# Patient Record
Sex: Male | Born: 1955 | Race: Black or African American | Hispanic: No | Smoking: Current every day smoker
Health system: Southern US, Community
[De-identification: ages and names within clinical notes are randomized; demographics above are authoritative.]

## PROBLEM LIST (undated history)

## (undated) DIAGNOSIS — I10 Essential (primary) hypertension: Secondary | ICD-10-CM

## (undated) DIAGNOSIS — K219 Gastro-esophageal reflux disease without esophagitis: Secondary | ICD-10-CM

## (undated) DIAGNOSIS — E079 Disorder of thyroid, unspecified: Secondary | ICD-10-CM

## (undated) DIAGNOSIS — D696 Thrombocytopenia, unspecified: Secondary | ICD-10-CM

## (undated) DIAGNOSIS — F209 Schizophrenia, unspecified: Secondary | ICD-10-CM

---

## 2012-02-03 ENCOUNTER — Emergency Department: Payer: Self-pay | Admitting: Emergency Medicine

## 2012-02-03 LAB — COMPREHENSIVE METABOLIC PANEL
Alkaline Phosphatase: 63 U/L (ref 50–136)
Anion Gap: 4 — ABNORMAL LOW (ref 7–16)
BUN: 15 mg/dL (ref 7–18)
Bilirubin,Total: 0.3 mg/dL (ref 0.2–1.0)
Chloride: 113 mmol/L — ABNORMAL HIGH (ref 98–107)
EGFR (African American): 53 — ABNORMAL LOW
Glucose: 70 mg/dL (ref 65–99)
Osmolality: 284 (ref 275–301)
Potassium: 4.2 mmol/L (ref 3.5–5.1)
SGOT(AST): 43 U/L — ABNORMAL HIGH (ref 15–37)
Sodium: 143 mmol/L (ref 136–145)
Total Protein: 8 g/dL (ref 6.4–8.2)

## 2012-02-03 LAB — CBC
HCT: 35 % — ABNORMAL LOW (ref 40.0–52.0)
MCHC: 33.2 g/dL (ref 32.0–36.0)
MCV: 102 fL — ABNORMAL HIGH (ref 80–100)
RBC: 3.44 10*6/uL — ABNORMAL LOW (ref 4.40–5.90)
RDW: 18.9 % — ABNORMAL HIGH (ref 11.5–14.5)
WBC: 4.8 10*3/uL (ref 3.8–10.6)

## 2012-02-03 LAB — URINALYSIS, COMPLETE
Bacteria: NONE SEEN
Bilirubin,UR: NEGATIVE
Blood: NEGATIVE
Glucose,UR: NEGATIVE mg/dL (ref 0–75)
Leukocyte Esterase: NEGATIVE
Nitrite: NEGATIVE
Ph: 6 (ref 4.5–8.0)
RBC,UR: 1 /HPF (ref 0–5)
Specific Gravity: 1.011 (ref 1.003–1.030)
Squamous Epithelial: NONE SEEN
WBC UR: 1 /HPF (ref 0–5)

## 2012-02-27 ENCOUNTER — Inpatient Hospital Stay: Payer: Self-pay | Admitting: Internal Medicine

## 2012-02-27 LAB — URINALYSIS, COMPLETE
Bacteria: NONE SEEN
Bilirubin,UR: NEGATIVE
Glucose,UR: NEGATIVE mg/dL (ref 0–75)
Ketone: NEGATIVE
Leukocyte Esterase: NEGATIVE
Protein: NEGATIVE
RBC,UR: NONE SEEN /HPF (ref 0–5)
Specific Gravity: 1.006 (ref 1.003–1.030)
WBC UR: <1 /HPF (ref 0–5)

## 2012-02-27 LAB — CBC
HCT: 32.7 % — ABNORMAL LOW (ref 40.0–52.0)
MCH: 33.7 pg (ref 26.0–34.0)
MCV: 101 fL — ABNORMAL HIGH (ref 80–100)
Platelet: 76 10*3/uL — ABNORMAL LOW (ref 150–440)
RDW: 19.6 % — ABNORMAL HIGH (ref 11.5–14.5)

## 2012-02-27 LAB — TROPONIN I: Troponin-I: <0.02 ng/mL

## 2012-02-27 LAB — ETHANOL
Ethanol %: <0.003 % (ref 0.000–0.080)
Ethanol: <3 mg/dL

## 2012-02-27 LAB — COMPREHENSIVE METABOLIC PANEL
Albumin: 2.6 g/dL — ABNORMAL LOW (ref 3.4–5.0)
Alkaline Phosphatase: 57 U/L (ref 50–136)
Bilirubin,Total: 0.3 mg/dL (ref 0.2–1.0)
Calcium, Total: 8.8 mg/dL (ref 8.5–10.1)
Chloride: 117 mmol/L — ABNORMAL HIGH (ref 98–107)
Co2: 26 mmol/L (ref 21–32)
EGFR (African American): 56 — ABNORMAL LOW
EGFR (Non-African Amer.): 48 — ABNORMAL LOW
Osmolality: 295 (ref 275–301)
SGOT(AST): 45 U/L — ABNORMAL HIGH (ref 15–37)
SGPT (ALT): 37 U/L (ref 12–78)
Sodium: 149 mmol/L — ABNORMAL HIGH (ref 136–145)

## 2012-02-27 LAB — SALICYLATE LEVEL: Salicylates, Serum: <1.7 mg/dL

## 2012-02-27 LAB — LITHIUM LEVEL: Lithium: 1.26 mmol/L — ABNORMAL HIGH

## 2012-02-28 LAB — DIFFERENTIAL
Basophil #: 0 10*3/uL (ref 0.0–0.1)
Basophil %: 0.4 %
Eosinophil #: 0 10*3/uL (ref 0.0–0.7)
Eosinophil %: 0 %
Lymphocyte %: 31.6 %
Monocyte %: 8.3 %
Neutrophil #: 2.9 10*3/uL (ref 1.4–6.5)

## 2012-02-28 LAB — BASIC METABOLIC PANEL
BUN: 11 mg/dL (ref 7–18)
Calcium, Total: 9.2 mg/dL (ref 8.5–10.1)
Chloride: 115 mmol/L — ABNORMAL HIGH (ref 98–107)
Co2: 28 mmol/L (ref 21–32)
Creatinine: 1.43 mg/dL — ABNORMAL HIGH (ref 0.60–1.30)
EGFR (African American): >60
Osmolality: 294 (ref 275–301)
Potassium: 4.1 mmol/L (ref 3.5–5.1)

## 2012-02-28 LAB — VALPROIC ACID LEVEL: Valproic Acid: 60 ug/mL

## 2012-02-29 LAB — BASIC METABOLIC PANEL
Anion Gap: 10 (ref 7–16)
BUN: 9 mg/dL (ref 7–18)
Calcium, Total: 9.1 mg/dL (ref 8.5–10.1)
Chloride: 111 mmol/L — ABNORMAL HIGH (ref 98–107)
Creatinine: 1.27 mg/dL (ref 0.60–1.30)
EGFR (African American): >60
EGFR (Non-African Amer.): >60
Glucose: 80 mg/dL (ref 65–99)
Osmolality: 290 (ref 275–301)
Potassium: 3.9 mmol/L (ref 3.5–5.1)

## 2012-02-29 LAB — LITHIUM LEVEL: Lithium: 0.54 mmol/L — ABNORMAL LOW

## 2012-03-01 LAB — CBC WITH DIFFERENTIAL/PLATELET
Eosinophil #: 0 10*3/uL (ref 0.0–0.7)
Eosinophil %: 0 %
HCT: 31.1 % — ABNORMAL LOW (ref 40.0–52.0)
Lymphocyte #: 2 10*3/uL (ref 1.0–3.6)
MCH: 33.8 pg (ref 26.0–34.0)
MCHC: 33.5 g/dL (ref 32.0–36.0)
MCV: 101 fL — ABNORMAL HIGH (ref 80–100)
Neutrophil %: 48.3 %
RDW: 19.7 % — ABNORMAL HIGH (ref 11.5–14.5)

## 2012-03-02 LAB — CBC WITH DIFFERENTIAL/PLATELET
Basophil #: 0 10*3/uL (ref 0.0–0.1)
Basophil %: 0.5 %
Eosinophil #: 0 10*3/uL (ref 0.0–0.7)
HCT: 30.7 % — ABNORMAL LOW (ref 40.0–52.0)
HGB: 10.2 g/dL — ABNORMAL LOW (ref 13.0–18.0)
Lymphocyte %: 34.6 %
Monocyte %: 11.7 %
Neutrophil #: 2.5 10*3/uL (ref 1.4–6.5)
Neutrophil %: 53.1 %
RBC: 3.04 10*6/uL — ABNORMAL LOW (ref 4.40–5.90)
RDW: 20.3 % — ABNORMAL HIGH (ref 11.5–14.5)
WBC: 4.8 10*3/uL (ref 3.8–10.6)

## 2012-03-03 LAB — BASIC METABOLIC PANEL
Anion Gap: 6 — ABNORMAL LOW (ref 7–16)
BUN: 12 mg/dL (ref 7–18)
BUN: 10 mg/dL (ref 7–18)
Calcium, Total: 8.3 mg/dL — ABNORMAL LOW (ref 8.5–10.1)
EGFR (African American): >60
EGFR (African American): >60
EGFR (Non-African Amer.): 60 — ABNORMAL LOW
EGFR (Non-African Amer.): >60
Glucose: 77 mg/dL (ref 65–99)
Glucose: 102 mg/dL — ABNORMAL HIGH (ref 65–99)
Osmolality: 289 (ref 275–301)
Potassium: 3.9 mmol/L (ref 3.5–5.1)
Potassium: 3.6 mmol/L (ref 3.5–5.1)
Sodium: 146 mmol/L — ABNORMAL HIGH (ref 136–145)
Sodium: 143 mmol/L (ref 136–145)

## 2012-09-24 ENCOUNTER — Emergency Department: Payer: Self-pay | Admitting: Emergency Medicine

## 2012-09-24 LAB — URINALYSIS, COMPLETE
Bacteria: NONE SEEN
Bilirubin,UR: NEGATIVE
Glucose,UR: NEGATIVE mg/dL (ref 0–75)
Ketone: NEGATIVE
Leukocyte Esterase: NEGATIVE
Nitrite: NEGATIVE
Ph: 6 (ref 4.5–8.0)
Protein: NEGATIVE
RBC,UR: 2 /HPF (ref 0–5)
WBC UR: NONE SEEN /HPF (ref 0–5)

## 2012-09-24 LAB — DRUG SCREEN, URINE
Amphetamines, Ur Screen: NEGATIVE (ref ?–1000)
Barbiturates, Ur Screen: NEGATIVE (ref ?–200)
Benzodiazepine, Ur Scrn: NEGATIVE (ref ?–200)
Cocaine Metabolite,Ur ~~LOC~~: NEGATIVE (ref ?–300)
MDMA (Ecstasy)Ur Screen: NEGATIVE (ref ?–500)
Opiate, Ur Screen: NEGATIVE (ref ?–300)
Phencyclidine (PCP) Ur S: NEGATIVE (ref ?–25)
Tricyclic, Ur Screen: NEGATIVE (ref ?–1000)

## 2012-09-24 LAB — COMPREHENSIVE METABOLIC PANEL
Albumin: 3.5 g/dL (ref 3.4–5.0)
Alkaline Phosphatase: 143 U/L — ABNORMAL HIGH (ref 50–136)
BUN: 7 mg/dL (ref 7–18)
Bilirubin,Total: 0.3 mg/dL (ref 0.2–1.0)
Calcium, Total: 9.1 mg/dL (ref 8.5–10.1)
Chloride: 112 mmol/L — ABNORMAL HIGH (ref 98–107)
Creatinine: 0.87 mg/dL (ref 0.60–1.30)
EGFR (Non-African Amer.): >60
Glucose: 88 mg/dL (ref 65–99)
Osmolality: 279 (ref 275–301)
Potassium: 3.7 mmol/L (ref 3.5–5.1)
SGOT(AST): 29 U/L (ref 15–37)
SGPT (ALT): 24 U/L (ref 12–78)
Total Protein: 8 g/dL (ref 6.4–8.2)

## 2012-09-24 LAB — CBC
MCH: 32.8 pg (ref 26.0–34.0)
MCHC: 33.7 g/dL (ref 32.0–36.0)
MCV: 97 fL (ref 80–100)
RBC: 4.4 10*6/uL (ref 4.40–5.90)

## 2012-09-24 LAB — ETHANOL: Ethanol %: <0.003 % (ref 0.000–0.080)

## 2012-09-25 LAB — CBC WITH DIFFERENTIAL/PLATELET
HCT: 39.6 % — ABNORMAL LOW (ref 40.0–52.0)
HGB: 13.3 g/dL (ref 13.0–18.0)
MCH: 32.5 pg (ref 26.0–34.0)
MCHC: 33.5 g/dL (ref 32.0–36.0)
Monocytes: 8 %
Platelet: 168 10*3/uL (ref 150–440)
Segmented Neutrophils: 56 %
WBC: 5.5 10*3/uL (ref 3.8–10.6)

## 2012-10-02 ENCOUNTER — Emergency Department: Payer: Self-pay | Admitting: Emergency Medicine

## 2012-10-02 LAB — COMPREHENSIVE METABOLIC PANEL
Albumin: 3.3 g/dL — ABNORMAL LOW (ref 3.4–5.0)
Alkaline Phosphatase: 138 U/L — ABNORMAL HIGH (ref 50–136)
Anion Gap: 7 (ref 7–16)
BUN: 8 mg/dL (ref 7–18)
Calcium, Total: 9 mg/dL (ref 8.5–10.1)
Chloride: 109 mmol/L — ABNORMAL HIGH (ref 98–107)
Creatinine: 0.9 mg/dL (ref 0.60–1.30)
EGFR (Non-African Amer.): >60
Glucose: 91 mg/dL (ref 65–99)
Osmolality: 279 (ref 275–301)
Potassium: 4 mmol/L (ref 3.5–5.1)
SGPT (ALT): 27 U/L (ref 12–78)
Sodium: 141 mmol/L (ref 136–145)
Total Protein: 7.5 g/dL (ref 6.4–8.2)

## 2012-10-02 LAB — URINALYSIS, COMPLETE
Bacteria: NONE SEEN
Bilirubin,UR: NEGATIVE
Blood: NEGATIVE
Glucose,UR: NEGATIVE mg/dL (ref 0–75)
Ketone: NEGATIVE
Leukocyte Esterase: NEGATIVE
Nitrite: NEGATIVE
Protein: NEGATIVE
RBC,UR: <1 /HPF (ref 0–5)
Specific Gravity: 1.013 (ref 1.003–1.030)
Squamous Epithelial: NONE SEEN
WBC UR: NONE SEEN /HPF (ref 0–5)

## 2012-10-02 LAB — CBC
HCT: 43.6 % (ref 40.0–52.0)
MCV: 98 fL (ref 80–100)
Platelet: 203 10*3/uL (ref 150–440)
RBC: 4.46 10*6/uL (ref 4.40–5.90)
WBC: 6.2 10*3/uL (ref 3.8–10.6)

## 2012-10-02 LAB — TSH: Thyroid Stimulating Horm: 30.4 u[IU]/mL — ABNORMAL HIGH

## 2012-10-02 LAB — SALICYLATE LEVEL: Salicylates, Serum: 2.9 mg/dL — ABNORMAL HIGH

## 2012-10-02 LAB — DRUG SCREEN, URINE
Amphetamines, Ur Screen: NEGATIVE (ref ?–1000)
Barbiturates, Ur Screen: NEGATIVE (ref ?–200)
Cannabinoid 50 Ng, Ur ~~LOC~~: NEGATIVE (ref ?–50)
Cocaine Metabolite,Ur ~~LOC~~: NEGATIVE (ref ?–300)
Methadone, Ur Screen: NEGATIVE (ref ?–300)
Opiate, Ur Screen: NEGATIVE (ref ?–300)
Phencyclidine (PCP) Ur S: NEGATIVE (ref ?–25)
Tricyclic, Ur Screen: NEGATIVE (ref ?–1000)

## 2012-10-02 LAB — ETHANOL
Ethanol %: <0.003 % (ref 0.000–0.080)
Ethanol: <3 mg/dL

## 2012-10-03 LAB — DIFFERENTIAL
Basophil #: 0 10*3/uL (ref 0.0–0.1)
Basophil %: 0.4 %
Eosinophil #: 0 10*3/uL (ref 0.0–0.7)
Eosinophil %: 0.1 %
Lymphocyte #: 2.3 10*3/uL (ref 1.0–3.6)
Lymphocyte %: 37.5 %
Monocyte #: 0.7 x10 3/mm (ref 0.2–1.0)
Monocyte %: 11 %
Neutrophil #: 3.1 10*3/uL (ref 1.4–6.5)
Neutrophil %: 51 %

## 2012-10-03 LAB — WBC: WBC: 6 10*3/uL (ref 3.8–10.6)

## 2012-12-22 LAB — COMPREHENSIVE METABOLIC PANEL
Albumin: 3.4 g/dL (ref 3.4–5.0)
Alkaline Phosphatase: 144 U/L — ABNORMAL HIGH (ref 50–136)
Anion Gap: 6 — ABNORMAL LOW (ref 7–16)
BUN: 9 mg/dL (ref 7–18)
Bilirubin,Total: 0.4 mg/dL (ref 0.2–1.0)
Calcium, Total: 9.3 mg/dL (ref 8.5–10.1)
Chloride: 113 mmol/L — ABNORMAL HIGH (ref 98–107)
Creatinine: 1.03 mg/dL (ref 0.60–1.30)
EGFR (African American): >60
EGFR (Non-African Amer.): >60
Glucose: 118 mg/dL — ABNORMAL HIGH (ref 65–99)
Osmolality: 285 (ref 275–301)
Potassium: 3.6 mmol/L (ref 3.5–5.1)
SGOT(AST): 26 U/L (ref 15–37)
SGPT (ALT): 25 U/L (ref 12–78)

## 2012-12-22 LAB — CBC
HCT: 43.7 % (ref 40.0–52.0)
MCHC: 33.4 g/dL (ref 32.0–36.0)
MCV: 93 fL (ref 80–100)
Platelet: 27 10*3/uL — CL (ref 150–440)
RDW: 14.3 % (ref 11.5–14.5)

## 2012-12-22 LAB — TSH: Thyroid Stimulating Horm: 0.668 u[IU]/mL

## 2012-12-22 LAB — ETHANOL: Ethanol: <3 mg/dL

## 2012-12-23 LAB — DIFFERENTIAL
Basophil #: 0 10*3/uL (ref 0.0–0.1)
Eosinophil %: 0 %
Monocyte #: 0.6 x10 3/mm (ref 0.2–1.0)
Monocyte %: 11 %
Neutrophil #: 2.9 10*3/uL (ref 1.4–6.5)

## 2012-12-23 LAB — URINALYSIS, COMPLETE
Bilirubin,UR: NEGATIVE
Glucose,UR: NEGATIVE mg/dL (ref 0–75)
Leukocyte Esterase: NEGATIVE
Nitrite: NEGATIVE
Protein: NEGATIVE
Squamous Epithelial: NONE SEEN
WBC UR: 4 /HPF (ref 0–5)

## 2012-12-23 LAB — DRUG SCREEN, URINE
Amphetamines, Ur Screen: NEGATIVE (ref ?–1000)
Barbiturates, Ur Screen: NEGATIVE (ref ?–200)
Cocaine Metabolite,Ur ~~LOC~~: NEGATIVE (ref ?–300)
Methadone, Ur Screen: NEGATIVE (ref ?–300)
Opiate, Ur Screen: NEGATIVE (ref ?–300)
Tricyclic, Ur Screen: NEGATIVE (ref ?–1000)

## 2012-12-24 ENCOUNTER — Inpatient Hospital Stay: Payer: Self-pay | Admitting: Psychiatry

## 2012-12-25 LAB — CBC WITH DIFFERENTIAL/PLATELET
Eosinophil #: 0 10*3/uL (ref 0.0–0.7)
Eosinophil %: 0.1 %
HCT: 42 % (ref 40.0–52.0)
HGB: 13.7 g/dL (ref 13.0–18.0)
MCH: 30.3 pg (ref 26.0–34.0)
MCHC: 32.6 g/dL (ref 32.0–36.0)
Neutrophil #: 3.1 10*3/uL (ref 1.4–6.5)
Neutrophil %: 55.5 %
Platelet: 62 10*3/uL — ABNORMAL LOW (ref 150–440)
RBC: 4.52 10*6/uL (ref 4.40–5.90)
WBC: 5.6 10*3/uL (ref 3.8–10.6)

## 2012-12-25 LAB — DIFFERENTIAL: Monocytes: 10 %

## 2012-12-26 LAB — CBC WITH DIFFERENTIAL/PLATELET
Basophil %: 0.4 %
Eosinophil #: 0 10*3/uL (ref 0.0–0.7)
Eosinophil %: 0 %
HCT: 43.5 % (ref 40.0–52.0)
Lymphocyte #: 1.9 10*3/uL (ref 1.0–3.6)
Lymphocyte %: 32.3 %
MCHC: 33.2 g/dL (ref 32.0–36.0)
MCV: 94 fL (ref 80–100)
Monocyte #: 0.5 x10 3/mm (ref 0.2–1.0)
Monocyte %: 9.2 %
Platelet: 66 10*3/uL — ABNORMAL LOW (ref 150–440)
RBC: 4.62 10*6/uL (ref 4.40–5.90)
RDW: 14.3 % (ref 11.5–14.5)
WBC: 5.9 10*3/uL (ref 3.8–10.6)

## 2012-12-26 LAB — COMPREHENSIVE METABOLIC PANEL
Albumin: 3.3 g/dL — ABNORMAL LOW (ref 3.4–5.0)
Alkaline Phosphatase: 154 U/L — ABNORMAL HIGH (ref 50–136)
Anion Gap: 8 (ref 7–16)
Bilirubin,Total: 0.3 mg/dL (ref 0.2–1.0)
Chloride: 108 mmol/L — ABNORMAL HIGH (ref 98–107)
EGFR (Non-African Amer.): >60
Glucose: 137 mg/dL — ABNORMAL HIGH (ref 65–99)
Potassium: 4.1 mmol/L (ref 3.5–5.1)
SGOT(AST): 25 U/L (ref 15–37)
Total Protein: 7.5 g/dL (ref 6.4–8.2)

## 2012-12-26 LAB — PROTIME-INR: INR: 0.9

## 2012-12-27 LAB — PLATELET COUNT: Platelet: 75 10*3/uL — ABNORMAL LOW (ref 150–440)

## 2012-12-29 LAB — DIFFERENTIAL
Basophil %: 0.6 %
Eosinophil #: 0 10*3/uL (ref 0.0–0.7)
Eosinophil %: 0 %
Lymphocyte #: 2.4 10*3/uL (ref 1.0–3.6)
Lymphocyte %: 40.2 %
Monocyte #: 0.8 x10 3/mm (ref 0.2–1.0)
Neutrophil #: 2.7 10*3/uL (ref 1.4–6.5)

## 2012-12-29 LAB — WBC: WBC: 5.9 10*3/uL (ref 3.8–10.6)

## 2012-12-30 LAB — CBC WITH DIFFERENTIAL/PLATELET
Basophil #: 0 10*3/uL (ref 0.0–0.1)
Eosinophil #: 0 10*3/uL (ref 0.0–0.7)
Eosinophil %: 0 %
HGB: 13.5 g/dL (ref 13.0–18.0)
Lymphocyte %: 38.7 %
MCH: 31 pg (ref 26.0–34.0)
MCHC: 33.2 g/dL (ref 32.0–36.0)
MCV: 94 fL (ref 80–100)
Monocyte #: 0.7 x10 3/mm (ref 0.2–1.0)
Neutrophil %: 47.8 %
RBC: 4.34 10*6/uL — ABNORMAL LOW (ref 4.40–5.90)
WBC: 5.4 10*3/uL (ref 3.8–10.6)

## 2013-01-01 LAB — CBC WITH DIFFERENTIAL/PLATELET
Eosinophil #: 0 10*3/uL (ref 0.0–0.7)
HCT: 40.2 % (ref 40.0–52.0)
HGB: 13.5 g/dL (ref 13.0–18.0)
Lymphocyte #: 2.1 10*3/uL (ref 1.0–3.6)
Lymphocyte %: 38.6 %
MCH: 31 pg (ref 26.0–34.0)
Monocyte %: 13.2 %
Neutrophil %: 47.7 %
RBC: 4.34 10*6/uL — ABNORMAL LOW (ref 4.40–5.90)
RDW: 14 % (ref 11.5–14.5)
WBC: 5.4 10*3/uL (ref 3.8–10.6)

## 2013-01-03 LAB — PLATELET COUNT: Platelet: 85 10*3/uL — ABNORMAL LOW (ref 150–440)

## 2013-02-03 ENCOUNTER — Emergency Department: Payer: Self-pay | Admitting: Emergency Medicine

## 2013-02-03 LAB — COMPREHENSIVE METABOLIC PANEL
Albumin: 3.4 g/dL (ref 3.4–5.0)
Anion Gap: 3 — ABNORMAL LOW (ref 7–16)
BUN: 13 mg/dL (ref 7–18)
Bilirubin,Total: 0.3 mg/dL (ref 0.2–1.0)
Calcium, Total: 9.2 mg/dL (ref 8.5–10.1)
EGFR (African American): >60
EGFR (Non-African Amer.): >60
Glucose: 83 mg/dL (ref 65–99)
Osmolality: 273 (ref 275–301)
Potassium: 3.7 mmol/L (ref 3.5–5.1)
SGPT (ALT): 23 U/L (ref 12–78)
Sodium: 137 mmol/L (ref 136–145)
Total Protein: 7.5 g/dL (ref 6.4–8.2)

## 2013-02-03 LAB — DRUG SCREEN, URINE
Benzodiazepine, Ur Scrn: NEGATIVE (ref ?–200)
Cocaine Metabolite,Ur ~~LOC~~: NEGATIVE (ref ?–300)
MDMA (Ecstasy)Ur Screen: NEGATIVE (ref ?–500)
Opiate, Ur Screen: NEGATIVE (ref ?–300)

## 2013-02-03 LAB — CBC
HCT: 41 % (ref 40.0–52.0)
HGB: 14.2 g/dL (ref 13.0–18.0)
MCH: 32 pg (ref 26.0–34.0)
MCHC: 34.6 g/dL (ref 32.0–36.0)
RBC: 4.43 10*6/uL (ref 4.40–5.90)
WBC: 6 10*3/uL (ref 3.8–10.6)

## 2013-02-03 LAB — URINALYSIS, COMPLETE
Glucose,UR: NEGATIVE mg/dL (ref 0–75)
Ketone: NEGATIVE
Protein: NEGATIVE

## 2013-02-03 LAB — ETHANOL: Ethanol %: <0.003 % (ref 0.000–0.080)

## 2013-02-03 LAB — ACETAMINOPHEN LEVEL: Acetaminophen: <2 ug/mL

## 2013-02-03 LAB — SALICYLATE LEVEL: Salicylates, Serum: 4 mg/dL — ABNORMAL HIGH

## 2013-02-05 ENCOUNTER — Emergency Department: Payer: Self-pay | Admitting: Emergency Medicine

## 2013-02-05 LAB — DRUG SCREEN, URINE
Amphetamines, Ur Screen: NEGATIVE (ref ?–1000)
Benzodiazepine, Ur Scrn: NEGATIVE (ref ?–200)
Cannabinoid 50 Ng, Ur ~~LOC~~: NEGATIVE (ref ?–50)
Cocaine Metabolite,Ur ~~LOC~~: NEGATIVE (ref ?–300)
MDMA (Ecstasy)Ur Screen: NEGATIVE (ref ?–500)
Phencyclidine (PCP) Ur S: NEGATIVE (ref ?–25)

## 2013-02-05 LAB — CBC
HCT: 41.9 % (ref 40.0–52.0)
MCH: 31.7 pg (ref 26.0–34.0)
Platelet: 202 10*3/uL (ref 150–440)
RBC: 4.52 10*6/uL (ref 4.40–5.90)
RDW: 15 % — ABNORMAL HIGH (ref 11.5–14.5)

## 2013-02-05 LAB — URINALYSIS, COMPLETE
Bacteria: NONE SEEN
Bilirubin,UR: NEGATIVE
Blood: NEGATIVE
Glucose,UR: NEGATIVE mg/dL (ref 0–75)
Nitrite: NEGATIVE
RBC,UR: <1 /HPF (ref 0–5)
WBC UR: 3 /HPF (ref 0–5)

## 2013-02-05 LAB — COMPREHENSIVE METABOLIC PANEL
Albumin: 3.6 g/dL (ref 3.4–5.0)
Anion Gap: 4 — ABNORMAL LOW (ref 7–16)
Bilirubin,Total: 0.3 mg/dL (ref 0.2–1.0)
Chloride: 106 mmol/L (ref 98–107)
Co2: 28 mmol/L (ref 21–32)
EGFR (African American): >60
Glucose: 104 mg/dL — ABNORMAL HIGH (ref 65–99)
Potassium: 3.7 mmol/L (ref 3.5–5.1)
SGOT(AST): 22 U/L (ref 15–37)
SGPT (ALT): 24 U/L (ref 12–78)
Sodium: 138 mmol/L (ref 136–145)

## 2013-02-05 LAB — ETHANOL
Ethanol %: <0.003 % (ref 0.000–0.080)
Ethanol: <3 mg/dL

## 2013-02-12 LAB — DIFFERENTIAL
Basophil %: 0.6 %
Eosinophil %: 0 %
Lymphocyte #: 2.7 10*3/uL (ref 1.0–3.6)
Lymphocyte %: 37.2 %
Monocyte %: 7.3 %
Neutrophil #: 3.9 10*3/uL (ref 1.4–6.5)

## 2013-02-12 LAB — WBC: WBC: 7.2 x10 3/mm 3

## 2013-02-22 LAB — ETHANOL
Ethanol %: <0.003 % (ref 0.000–0.080)
Ethanol: <3 mg/dL

## 2013-02-22 LAB — COMPREHENSIVE METABOLIC PANEL
Alkaline Phosphatase: 148 U/L — ABNORMAL HIGH (ref 50–136)
Anion Gap: 7 (ref 7–16)
BUN: 11 mg/dL (ref 7–18)
Bilirubin,Total: 0.4 mg/dL (ref 0.2–1.0)
Chloride: 108 mmol/L — ABNORMAL HIGH (ref 98–107)
Co2: 25 mmol/L (ref 21–32)
Creatinine: 1.12 mg/dL (ref 0.60–1.30)
EGFR (African American): >60
EGFR (Non-African Amer.): >60
Potassium: 3.7 mmol/L (ref 3.5–5.1)
SGOT(AST): 25 U/L (ref 15–37)
SGPT (ALT): 27 U/L (ref 12–78)

## 2013-02-22 LAB — CBC
HCT: 42.9 % (ref 40.0–52.0)
MCV: 93 fL (ref 80–100)
Platelet: 236 10*3/uL (ref 150–440)
RBC: 4.64 10*6/uL (ref 4.40–5.90)
RDW: 14.8 % — ABNORMAL HIGH (ref 11.5–14.5)
WBC: 8.3 10*3/uL (ref 3.8–10.6)

## 2013-02-22 LAB — TSH: Thyroid Stimulating Horm: 2.11 u[IU]/mL

## 2013-02-23 LAB — DRUG SCREEN, URINE
Amphetamines, Ur Screen: NEGATIVE
Barbiturates, Ur Screen: NEGATIVE
Benzodiazepine, Ur Scrn: NEGATIVE
Cannabinoid 50 Ng, Ur ~~LOC~~: NEGATIVE
Cocaine Metabolite,Ur ~~LOC~~: NEGATIVE
MDMA (Ecstasy)Ur Screen: NEGATIVE
Methadone, Ur Screen: NEGATIVE
Opiate, Ur Screen: NEGATIVE
Phencyclidine (PCP) Ur S: NEGATIVE
Tricyclic, Ur Screen: NEGATIVE

## 2013-02-23 LAB — DIFFERENTIAL
Basophil #: 0 10*3/uL (ref 0.0–0.1)
Basophil %: 0.5 %
Eosinophil #: 0 10*3/uL (ref 0.0–0.7)
Eosinophil %: 0 %
Monocyte %: 10.2 %
Neutrophil #: 3.7 10*3/uL (ref 1.4–6.5)

## 2013-02-23 LAB — URINALYSIS, COMPLETE
Bacteria: NONE SEEN
Ketone: NEGATIVE
Nitrite: NEGATIVE
Ph: 5 (ref 4.5–8.0)
Protein: NEGATIVE
RBC,UR: NONE SEEN /HPF (ref 0–5)
Specific Gravity: 1.005 (ref 1.003–1.030)
Squamous Epithelial: <1
WBC UR: 1 /HPF (ref 0–5)

## 2013-02-23 LAB — WBC: WBC: 6.1 10*3/uL (ref 3.8–10.6)

## 2013-02-24 ENCOUNTER — Inpatient Hospital Stay: Payer: Self-pay | Admitting: Psychiatry

## 2013-03-01 DIAGNOSIS — Z79899 Other long term (current) drug therapy: Secondary | ICD-10-CM

## 2013-03-01 LAB — VALPROIC ACID LEVEL: Valproic Acid: 90 ug/mL

## 2013-03-01 LAB — PLATELET COUNT: Platelet: 221 10*3/uL (ref 150–440)

## 2013-03-01 LAB — TROPONIN I: Troponin-I: <0.02 ng/mL

## 2013-03-02 LAB — DIFFERENTIAL
Basophil #: 0 10*3/uL (ref 0.0–0.1)
Basophil %: 0.2 %
Lymphocyte #: 1.6 10*3/uL (ref 1.0–3.6)
Lymphocyte %: 26.5 %
Monocyte #: 0.6 x10 3/mm (ref 0.2–1.0)
Neutrophil #: 3.9 10*3/uL (ref 1.4–6.5)
Neutrophil %: 63.6 %

## 2013-03-02 LAB — WBC: WBC: 6.1 10*3/uL (ref 3.8–10.6)

## 2013-03-09 LAB — DIFFERENTIAL
Basophil #: 0 10*3/uL (ref 0.0–0.1)
Eosinophil #: 0 10*3/uL (ref 0.0–0.7)
Lymphocyte #: 1.9 10*3/uL (ref 1.0–3.6)
Lymphocyte %: 36.1 %
Monocyte #: 0.7 x10 3/mm (ref 0.2–1.0)
Monocyte %: 12.6 %

## 2013-03-09 LAB — WBC: WBC: 5.2 10*3/uL (ref 3.8–10.6)

## 2013-03-10 LAB — PLATELET COUNT: Platelet: 216 10*3/uL (ref 150–440)

## 2014-09-27 NOTE — Consult Note (Signed)
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   Patient was seen by consultant.   Consult note dictated.   Recommend further assessment or treatment.   Orders entered.   Comments: Psychiatry: Patient seen. Reviewed history and labs. Current exam he can move all extremities and has no gross tremor but has poor fine motor control. Also has tardive dyskinesia. Is not a very useful historian.  Most likely problem is the elevated Lithium level. Will order stat recheck of Li and depakote and stat check of clozapine (they cant run it stat but we can get it drawn today). Will restart clozapine - not likely the cause of his presenting problem. Also check ammonia level in pt with hep C.  Electronic Signatures: Amar Keenum, Jackquline DenmarkJohn T (MD)  (Signed 20-Sep-13 11:59)  Authored: Brief Consult Note   Last Updated: 20-Sep-13 11:59 by Audery Amellapacs, Jacqulene Huntley T (MD)

## 2014-09-27 NOTE — Consult Note (Signed)
PATIENT NAME:  Randy Ferguson, Randy Ferguson MR#:  [id number] DATE OF BIRTH:  [birth year]  DATE OF CONSULTATION:  02/29/2012  REFERRING PHYSICIAN:  Dr. Elisabeth PigeonVachhani CONSULTING PHYSICIAN:  Hemang K. Sherryll BurgerShah, MD  REASON FOR CONSULTATION: Altered mental status and lethargy.   HISTORY OF PRESENT ILLNESS: [name] is a 59 year old African American gentleman with a history of schizophrenia, went to the new nursing home and seems like he did not receive his hypothyroid medication.   On admission he has a very elevated TSH. The nursing home sent him over here because he has been getting more lethargic and having some abnormal movements.   PAST MEDICAL HISTORY:  1. Schizophrenia.  2. Bipolar disorder.  3. Mild mental retardation.  4. Reflux.  5. Constipation. 6. Hypothyroidism. 7. Hepatitis C.  8. Latent tuberculosis infection. Negative in November 2012. 9. Treated for syphilis in the past.   PAST SURGICAL HISTORY: Negative.  FAMILY HISTORY: Not available.   MEDICATIONS: I reviewed his allergy and home medication list.   PHYSICAL EXAMINATION:  VITAL SIGNS: Temperature 98, pulse 86, respiratory rate 18, blood pressure 130/84, pulse ox 95%.   GENERAL: He is a middle-aged African American gentleman lying in bed, not in acute distress.   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 He is disabled.   LUNGS: Clear to auscultation.   HEART: S1, S2 heart sounds. Carotid exam did not reveal any bruit.   ABDOMEN: He has a protuberant abdomen.   MENTAL STATUS EXAMINATION: He was lethargic but arousable. As soon as he woke up, he wanted to eat and I gave him the things on his dish.   The patient is very difficult to understand, but he did follow one-step commands such as close your eyes, stick your tongue out, lift individual limbs which he did.    He has difficulty following two-step commands. He sometimes has childish behavior, such as when I asked him  to point to the ceiling he raised both hands to point to the ceiling.   He cannot follow two-step commands.   His voice is very difficult to understand and almost mumbling.   But I do not think he has a language disorder.   The patient also has difficulty with his attention and concentration.   He denied any active delusion or hallucination.   On his cranial nerves, his pupils were equal, round, and reactive. Extraocular movements are intact. His visual fields are very difficult to check for.   His face was symmetric. He does have some swelling of both upper eyelids.   His face was symmetric. Tongue was midline. Facial sensation seems to be intact. His hearing was difficult to assess for.   On his motor examination, he has mild hypertonia all over, but his strength seems to be symmetric. He does have some asterixis.   His sensations were intact to light touch. His reflexes were decreased.   ASSESSMENT AND PLAN:  Severe lethargy and altered mental status, likely from his metabolic encephalopathy with severe hypothyroidism, likely from not getting his medication.   He also has some hyperammonemia. This is likely coming from his Depakote.   The patient also had some elevated borderline levels of lithium and Depakote on admission which lithium has been held.   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   He does have tardive dyskinesia, likely from his chronic use of antipsychotic medications.  In terms of hypothyroidism, endocrine consult has been done and they have started replacement.   For his schizophrenia and other psychiatric issues, the patient has been followed by the psychiatrist.   Feel free to contact me with any further questions. I will follow this patient on an as-needed basis.    ____________________________ Durene Cal. Sherryll Burger, MD hks:ap D: 02/29/2012 17:51:10 ET T: 03/01/2012 11:51:23  ET JOB#: [id number]  cc: Hemang K. Sherryll Burger, MD, <Dictator>  Durene Cal [facility] MD ELECTRONICALLY SIGNED 03/02/2012 7:43

## 2014-09-27 NOTE — Consult Note (Signed)
Chief Complaint and History:   Referring Physician Dr. Sherryll BurgerShah    Chief Complaint Elevated TSH   Allergies:  No Known Allergies:   Assessment/Plan:   Orders/Results reviewed Orders reviewed and updated    Assessment/Plan 59 yo M with mental retardation and schizophrenia and hypothyroidism admitted yesterday with dizziness and altered mental status. He was found to have a TSH >100. I reviewed his records and a signed FL2 form from 11/2011 lists levothyroxine 125 mcg daily as one of his daily meds yet [facility] does not have the levothyroxine included. Patient was interviewed and examined and chart was reviewed.   A/ Profoundly uncontrolled hypothyroidism due to not taking levothyroxine. It seems this medication was no longer given to patient for at least the last 20 days and likely longer.    P/ Will restart levothyroxine. As he has no IV access and yet has already eaten today, I will give this IM. IM and IV are twice the potency of PO formulations of levothyroxine and yet he could tolerate a high initial dose of levothyroxine to start. Will give 200 mcg now IM and then restart levothyroxine 125 mcg daily. Recommend repeating the TSH in one week. I will recommend out-patient clinic follow-up in 3-4 weeks and will have this scheduled.  Full consult to be dictated.   Electronic Signatures: Raj JanusSolum, Anna M (MD)  (Signed 20-Sep-13 13:01)  Authored: Chief Complaint and History, ALLERGIES, Assessment/Plan   Last Updated: 20-Sep-13 13:01 by Raj JanusSolum, Anna M (MD)

## 2014-09-27 NOTE — Discharge Summary (Signed)
PATIENT NAME:  Randy Ferguson, Randy Ferguson DATE OF BIRTH:  Sep 16, 1955  DATE OF ADMISSION:  02/27/2012 DATE OF DISCHARGE:  03/03/2012   ADMITTING DIAGNOSIS: Altered mental status.   DISCHARGE DIAGNOSES:  1. Metabolic encephalopathy due to severe hypothyroidism. 2. Hyperammonemia likely due to Depakote use.  3. Questionable chronic liver disease.  4. History of hepatitis C. 5. Tardive dyskinesia  due to antipsychotic medications and tremor due to lithium toxicity.  6. Acute renal failure.  7. Hyponatremia.  8. Unsteady gait due to above.  9. Moderate to severe MR.  10. History of schizophrenia. 11. Gastroesophageal reflux disease.  12. Constipation.  13. Hepatitis C. 14. Benign prostatic hypertrophy.  15. Overactive bladder.   DISCHARGE CONDITION: Stable.   DISCHARGE MEDICATIONS:  1. The patient is to resume tamsulosin 0.4 mg p.o. daily.  2. Finasteride 5 mg p.o. daily.  3. Lorazepam 1 mg p.o. every six hours as needed.  4. Toviaz 8 mg p.o. daily.  5. Omeprazole 20 mg p.o. daily.  6. Vitamin D3, 5000 units once daily.  7. Clozapine 100 mg tablets, 5 tablets at bedtime which would 500 mg p.o. bedtime.  8. Docusate sodium 100 mg p.o. twice daily.  9. Lactulose 15 mL twice daily as needed.  10. Levothyroxine 125 mcg p.o. daily. The patient was advised not to drink or eat approximately 1 hour after he takes his levothyroxine.  11. He is not to take divalproex, sodium, lithium, or Loxapine unless recommended by primary care physician.   DIET: Regular. The patient was advised to drink plenty of fluids to have at least one pitcher of fluids placed at his bedside. His diet consistency with mechanical soft.   ACTIVITY LIMITATIONS: As tolerated.   FOLLOW-UP: Appointment with Dr. Lacie ScottsNiemeyer in two days after discharge as well as Dr. Tedd SiasSolum in one week after discharge. The patient was also advised to have  BMP checked in approximately 2 or three days, to have creatinine as well as  sodium levels checked to make sure that creatinine as well as sodium levels are not creeping up because of concerns of dehydration.   CONSULT: Dr Sherryll BurgerShah, Dr Toni Amendlapacs, Dr. Tedd SiasSolum.   RADIOLOGIC STUDIES: Chest portable, single view 19th of September 2013 showed hyperinflation with right lung base atelectasis. CT of head without contrast 02/27/2012 revealed no acute abnormality.   HISTORY OF PRESENT ILLNESS: The patient is a 59 year old African American male with past medical history significant for history of hypothyroidism. Was brought from group home because of abnormal movements as well as frequent falls. Please refer to Dr. Jim DesanctisVacchani's admission note on 02/27/2012. On arrival to the Emergency Room, the patient's temperature was 98, pulse was 82, respiratory rate 20, blood pressure 104/77, saturation was 97% on room air. Physical examination was unremarkable.. The patient was alert, however, not oriented. He was able to follow some commands. He had excessive abnormal movements in the limbs when he tried to move them. He was admitted to the hospital with diagnosis of lethargy.  It was concerning for possible metabolic encephalopathy due to hyponatremia as well as possibly medications.   LABORATORY, DIAGNOSTIC AND RADIOLOGICAL DATA: Initial labs revealed creatinine of 1.6, sodium 149, otherwise BMP was unremarkable. The patient's liver enzymes showed albumin level of 2.2, AST was elevated at 45, otherwise unremarkable. The patient's troponin level was less than 0.02. TSH was more than 100. The patient's lithium level was also elevated at 1.26. Valproic acid level was also elevated at 102. The patient's white blood cell  count was normal at 4.7, hemoglobin was 10.9, platelet count 76. Urinalysis was unremarkable. The patient's acetaminophen level was less than two, salicylate  level was less than 1.7.   EKG showed normal sinus rhythm at 85 beats per minute, normal axis, no acute ST-T changes. The patient had QTc  elevation to 433 ms.   The patient was admitted to the hospital for further evaluation. His psychiatric medications were placed on hold because of their abnormally high levels and concerns that the patient altered mental status could be related to lithium as well as valproic acid toxicity.  Consultations with Dr Toni Amend because of psychiatry disease as well as abnormal medication levels were obtained as well as Dr. Tedd Sias, because of a mildly elevated TSH to more than 100. Both of those physicians, Dr Toni Amend of as well as Dr. Tedd Sias, follow the patient along  while he was in the hospital.  1. In regards to his altered mental status,  it was felt that patient's altered mental status was metabolic encephalopathy due to severe hypothyroidism as well as possibly hyperammonemia, as the patient's ammonia level was found to be elevated to 60s as well as possibly toxicity due to valproic acid as well as lithium. Valproic acid as well as lithium were placed on hold, in fact, they were completely discontinued by Dr. Toni Amend. The patient's hyperthyroidism was treated initially with IV as well as IM medications and then later on oral medication doses. The patient is hyperammonemia was treated with lactulose and with this therapy the patient improved, however not significantly enough so it was felt that the patient could have too high dose of other psychiatric medications such as clozapine and the morning dose of clozapine was suspended. As soon as the morning dose of clozapine was suspended. the patient  woke up and he was much more active and energetic. Later on patient's clozapine levels came back and they were markedly abnormal. They were much higher than therapeutic recommended doses. The patient's clozapine  level was found to be combined total of 970 however, therapeutic effect was considered to be when some of clozapine as well as norclozapine concentrations were at least  450 ng/mL. It was felt that the patient  should discontinue his morning dose of clozapine and that was recommended by Dr. Toni Amend. In fact, Dr. Toni Amend felt that the patient's movement problems were likely related to his lithium as well as tardive dyskinesia. He felt that the patient had very significant tardive dyskinesia and is not likely to get much better and  for this reason, he recommended to use clozapine instead of typical antipsychotics. As clozapine level is very high,  he recommended to discontinue the morning dose and continued just afternoon dose. He felt that this morning dose of clozapine, the patient had unusual, oversedation as well as possibly some dizziness and as patient's clozapine was discontinued, the patient. The patient had physical therapy and he did fairly well. Group home was willing to see him back to group home with home health R.N. as well as physical therapy where the patient will be discharged today on the 24th of September 2013.  2.  He is to continue thyroid medications and follow up with Dr. Tedd Sias in the next one week after discharge to make decisions when to recheck his TSH or free T4 and free T3. 3.  In regards to hyperammonemia. The patient is to continue lactulose. He needs to follow up with his primary care physician and further investigate him for chronic  liver disease as he has history of hepatitis C and very likely patient's thrombocytopenia is related to his chronic liver disease.  4. The patient was noted to have  mild acute renal failure. On arrival to the Emergency Room, his creatinine was 1.6. With IV fluid hydration, his creatinine normalized, however, on the 24th of September 2013, patient's creatinine level was still elevated to high. It is recommended to follow the patient's creatinine level as outpatient and make sure that the patient does not have an abnormal creatinine level which is chronic for him already and could have been related to lithium in the past. As the patient's sodium level was also  abnormal elevated it was postulated patient also could have underlying diabetes insipidus again related to lithium itself. The patient was able to drink and eat well whenever his mental status improved and he was not noted to be excessive urination. However, it is recommended to follow the patient's sodium levels as well as creatinine levels as outpatient investigate and see him for possible diabetes if those levels are creeping up. Meanwhile, the patient is to continue to be recommended continuous oral fluid intake. He was advised to have at least one pitcher placed at his bedside with fluids and his caretakers were advised to encouraged him to drink plenty of fluids. During his stay in the hospital time, the patient was able to eat drink plenty and he was eating 100%  of offered food.  5. In regards to unsteady gait. It was felt that is was likely due to multiple problems, metabolic encephalopathy due to hypothyroidism, and hyperammonemia, as well as possible dyskinesia and tremors due to lithium, as well as toxicity as well as antipsychotic medication, high levels of antipsychotic psychotic medications. The patient is to continue physical therapy at home. 6. For his moderate to severe  moderate to severe  mental retardation, the patient is being discharged to group home for schizophrenia as mentioned above, the patient is to continue clozapine only at bedtime. He is to use only 500 mg at bedtime but not to take morning dose of clozapine. 7. For gastroesophageal reflux disease. The patient is to continue omeprazole. 8. For benign prostatic hypertrophy as well as overactive bladder. The patient is to continue tamsulosin, finasteride, as well as Toviaz.   The patient is being discharged in stable condition with the above-mentioned medications and follow-up. His vital signs on the day of discharge: Temperature 98.4, pulse 89, respiration rate 18, blood pressure 113/75, saturation 99% on room air at rest.    TIME SPENT: 40 minutes.     ____________________________ Katharina Caper, MD rv:ljs D: 03/04/2012 16:22:49 ET T: 03/05/2012 13:50:12 ET JOB#: [id number]  cc: Katharina Caper, MD, <Dictator> Laken Rog MD ELECTRONICALLY SIGNED 03/06/2012 15:03

## 2014-09-27 NOTE — Consult Note (Signed)
PATIENT NAME:  Randy Ferguson, Randy Ferguson MR#:  [id number] DATE OF BIRTH:  1955-11-01  DATE OF CONSULTATION:  02/28/2012  REFERRING PHYSICIAN:   CONSULTING PHYSICIAN:  Audery AmelJohn T. Rayann Jolley, MD  IDENTIFYING INFORMATION AND REASON FOR CONSULT: The patient is a 59 year old man brought into the Emergency Room by his group home with a complaint of abnormal movements and increase in falling. Consult for evaluation in a patient with chronic psychiatric problems.   HISTORY OF PRESENT ILLNESS: Information obtained primarily from the chart. The patient was able to try to talk with me, but his ability to convey information is not very useful. Evidently, he has been at his current group home for several months and they have noticed that he has had worsening trouble with his movement, coordination and with his falls. The exact time course is not although it well described. Additionally, the precise kind of movement problems is not very well described. It does not sound like they are likely to be seizures. There is no complaint that I see about anything that would be specifically psychiatric in nature. I do not see anything about him being violent, aggressive or self injuring. The patient is not able to give me any useful history. It appears that this is a chronically mentally ill gentleman in schizophrenia. He just recently moved to his current group home. He is able to tell me today that he has been in many hospitals in the past, although he cannot tell me much detail about it and cannot describe why he has been in psychiatric hospitals. He is not able to tell me anything about any of the specifics of his medicines or medicine dosing in the past. The medication record that is in the chart about his current medicine is clozapine 300 mg in the morning and 500 mg at night, Depakote 500 mg 2 tablets twice a day, Docusate 100 mg once a day, Finasteride 5 mg per day, lithium 300 mg in the morning and 600 at night, Lorazepam 1 mg every six  hours as needed, loxapine 25 mg once every 12 hours as needed, Omeprazole 20 mg per day, Tamsulosin 0.4 mg once a day, Toviaz 8 mg once a day and vitamin D3 5000 units once a day. We do not have any other history as far as I can tell about past psychiatric condition. Evidently, he has been treated by Dr. Sherin QuarryWeissman since being in our community. I do not know whether any of his medicines have been changed and I do not know when his last blood tests for any of them were done.   SOCIAL HISTORY: Currently living at a group home. I do not have any information about whether he has a guardian or power of attorney or where his family is and he is not able to give me any of that history.   REVIEW OF SYSTEMS: Again, the patient's ability to answer is limited by history. Denies suicidal or homicidal ideation. Says that he is still having trouble walking, but that he was able to get up and walk to the bathroom today. He does say that he has had some trouble falling recently. Does not endorse any other mental health or acute complaints.   MENTAL STATUS EXAM: Disheveled, chronically mentally ill-looking gentleman evaluated in his bed in the hospital. He was awake when I came into the room. He was alert and interacted with me. Made good eye contact. Psychomotor activity was fidgety. He was trying to sit up quite a bit. His ability to  sit up was limited by the way the bed was set and by his poor fine motor skills and also, probably by his distended abdomen. His speech was slurred and very difficult to understand. His affect was flat. His mood was stated as okay. His thoughts appeared to be simple and concrete and repetitive. Did not make any obviously bizarre statements. Did not indicate any hallucinations. Denied suicidal or homicidal ideation. He did not know where he was and could not tell me the year or date, but did seem to have some basic understanding that he was interacting with a doctor.   LABORATORY, DIAGNOSTIC AND  RADIOLOGICAL DATA: On admission his chemistry panel showed an elevated creatinine at 1.4, chloride elevated at 115. His thyroid stimulating hormone is over 100. His valproic acid level was 102, lithium level 1.26, both of which are high, but the lithium level more significantly so. His AST elevated at 45, albumin low at 2.6, chloride elevated at 117, sodium 149 and creatinine 1.6 on admission.   ASSESSMENT: 59 year old man with very little past history to go on but clearly with chronic mental illness who is presented to [place] as having a diagnosis of mental retardation and schizophrenia. He has been having more trouble moving and having more falls recently. I did not do a complete neurologic exam with the patient today. I do not know compared to yesterday or when he came in whether his current movement problems are any different than what he was having then. He clearly has gross tardive dyskinesia. He does not have a tremor and certainly does not have a typical lithium tremor, but could be having more neurologic problems related to the lithium. Again, without more specifics about exactly what they have been seeing changing, I think that possibilities here would be that his lithium level being too high has caused him to be unsteady and more uncoordinated, the severe hypothyroidism that he appears to have could be contributing to the neurologic problems, he obviously has severe tardive dyskinesia. The current medicine that he is taking usually would not make that worse except for the loxapine. I do not know whether they have been giving him loxapine regularly, but if they have that could be making the tardive dyskinesia worse.   TREATMENT PLAN: I agree with the initial plan of having discontinued all of his medicines, but I think he should be restarted on his clozapine. The clozapine is not likely to be a cause of his problems and without it, I would be afraid that his mental state could deteriorate. I did go ahead  and order a stat Clozaril level. They will not be able to run it stat, but at least we can get the blood drawn. I would continue off the lithium and Depakote for now until we see how the blood levels are changing. I have ordered stat levels to be done again today. I am glad to see that neurology is following up with him. I just saw the TSH. Looking through his labs right now, I am not sure how that is being addressed. Does not need admission to psychiatry. Will follow.   DIAGNOSIS PRINCIPLE AND PRIMARY:  AXIS I: Schizophrenia, undifferentiated.   SECONDARY DIAGNOSES:  AXIS I: No further.   AXIS II: Mental retardation, moderate to severe.   AXIS III:  1. Tardive dyskinesia. 2. Hepatitis C.   AXIS IV: Moderate to severe from recent relocation.   AXIS V: Functioning at time of evaluation, 27.   ____________________________ Jackquline Denmark.  Detrice Cales, MD jtc:ap D: 02/28/2012 12:11:10 ET T: 02/28/2012 13:04:20 ET JOB#: [id number]  cc: Audery Amel, MD, <Dictator> Audery Amel MD ELECTRONICALLY SIGNED 02/28/2012 16:40

## 2014-09-27 NOTE — Consult Note (Signed)
Details:    - Psychiatry: Been following patient over the weekend. He is often sleeping but when awake his mental state seems pretty stable. Still hard to understand and hard to assess for amount of psychotic symptoms but not aggressive. His involuntary motions are still present but seem better. The TD is not likely to improve much and treating with clozapine is about the best that can be done. He is slowly clearing Li and I didn't order another level onne he got down to 0.39.   I would not rec restarting lithium due to risk of toxicity and renal worsening and it not really being clear if it is clinically needed. I would also hold depakote at this time.  I note his ongoing thrombocytopenia. Doubt it is related to clozapine given that his WBC and neutrophils are are stilll normal.  Look forward to seeing the clozapine level come back to help give some clue if this is an appropriate dose for him. Will continue to check in on him.   Electronic Signatures: Marlon Vonruden, Jackquline DenmarkJohn T (MD)  (Signed 23-Sep-13 10:11)  Authored: Details   Last Updated: 23-Sep-13 10:11 by Audery Amellapacs, Tyde Lamison T (MD)

## 2014-09-27 NOTE — H&P (Signed)
PATIENT NAME:  Randy Ferguson, Randy Ferguson MR#:  [id number] DATE OF BIRTH:  1955/12/21  DATE OF ADMISSION:  02/27/2012  PRIMARY CARE PHYSICIAN: Dr. Daphane ShepherdMeyer PSYCHIATRIST: Dr. Barnett AbuWiseman  REFERRING ER PHYSICIAN: Dr. Silverio LayYao   History obtained from Randy Ferguson who is in charge of his group home from where the patient came and his contact number is [phone].   CHIEF COMPLAINT: Abnormal movement and frequent falls.   HISTORY OF PRESENT ILLNESS: Patient has a known case of mental retardation, schizophrenia, hepatitis C, bipolar disorder and he is transferred to the group home since May 2013. Initially he was more functional but gradually he started having abnormal movements and as per details obtained from Randy Ferguson they visited the primary medical doctor and referred to psychiatry doctor. He had to change his psychiatry doctor the one who he was following before [place] [place] [facility] and can no longer go there so the new doctor, Dr. Barnett AbuWiseman, is following for last few months. He thinks the reason for these abnormal movements and imbalance is most probably because of excessive medications but he could not figure out as he doesn't have clear history about what medications he was taking before and what was the effect and when they changed it. There have been trying to get more information from the previous medical records, from other hospitals and doctor's office but till now they are not successful for that as I got details from Randy Ferguson. As per the history he is getting progressively more and more having abnormal movements specifically while having rest or while standing still and because of that he has frequent falls and he is unable to hold the liquids in his hand or drink it properly. This problem is worsening gradually and now like last few days he has more frequent falls, approximately three to four times a day. He had been brought to the Emergency Room in past for fall but after work-up he was discharged back because of  no findings. Today they noticed he is a little more lethargic and he fell three times in last night and yesterday so they decided to send him to Emergency Room for further work-up. On work up his CT head is normal without any acute changes. They gave him Narcan injection but there was no improvement after the injection so [facility] work[facility]up and assessment.    REVIEW OF SYSTEMS: As mentioned above patient is unable to give any history or review of systems due to his mental status so unable to obtain at this time.   PAST MEDICAL HISTORY: Past medical history is obtained from the documents from the group home.  1. Schizophrenia. 2. Bipolar disorder. 3. Mild mental retardation. 4. Reflux. 5. Constipation. 6. Hypothyroidism. 7. Hepatitis C. 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 9. Treated for syphilis in the past.   PAST SURGICAL HISTORY: None.   FAMILY HISTORY: Unable to access as Randy Ferguson and his team had tried to contact his relatives but they never came to the facility but he has been taken care by the caretakers and he has legal guardians.   ALLERGIES: No known drug allergies as per the records available to the group home.   CURRENT MEDICATIONS:  1. Clozapine 100 mg tablet 3 tablets once a day in the morning and 5 tablets once a day at bedtime.  2. Divalproex sodium 500 mg oral tablet delayed release 2 tablets orally 2 times a day.  3. Docusate sodium 100  mg capsule 2 capsules once a day. 4. Finasteride 5 mg oral tablet once a day.  5. Lithium 300 mg oral capsule 1 capsule once a day in the morning and 2 capsules once a day at bedtime.  6. Lorazepam 1 mg oral tablet every six hours as needed for anxiety.  7. Loxapine 25 mg oral capsule, 1 capsule orally q.12 hours as needed for agitation.  8. Omeprazole 20 mg delayed-release capsule once a day.  9. Tamsulosin 0.4 mg oral capsule once a day.  10. Toviaz 8 mg oral tablet  extended release 1 tablet orally once a day. 11. Vitamin D3 5000 international units one capsule orally once a day in the morning.   PHYSICAL EXAMINATION:  VITAL SIGNS: Temperature 98, pulse rate 82, respirations 20, blood pressure 104/77, oxygen saturation 97 on room air.   GENERAL: Does not appear in any acute distress.    NEUROLOGICAL: He is fully alert but not oriented to time, place and person. He follows commands. There is excessive abnormal movements of all the limbs when try to move it. Power 5/5 all four limbs.   HEAD AND NECK: Atraumatic. No JVD. Conjunctivae pink. Oral mucosa moist.   RESPIRATORY: Bilateral clear and equal air entry.   CARDIOVASCULAR: S1, S2 present, regular. No murmur.   ABDOMEN: Soft and nontender, bowel sounds present.   EXTREMITIES: No edema. No rashes.   LABORATORY, DIAGNOSTIC AND RADIOLOGICAL DATA: Glucose 80, BUN 12, creatinine 1.6, sodium 149, potassium 4.2, chloride 117, CO2 26, ethanol less than 3, calcium 8.8, total protein 7, albumin 2.6, bilirubin 0.3, alkaline phosphatase 57, SGOT 45, SGPT 37, troponin less than 0.02, lithium serum 1.26, valproic acid 102. WBC 4.7, hemoglobin 10.9, platelet count 76, mean corpuscular volume 101. Urinalysis grossly negative. CT scan of the head no acute abnormality. X-ray chest hypoinflation with right lung base atelectasis.   ASSESSMENT: 59 year old male with mild mental retardation, schizophrenia, bipolar disorder, hypothyroidism, hep C lives in a group home sent to the Emergency Room because of worsening of imbalance and frequent abnormal movements, frequent falls and lethargy today.   PLAN:  1. Lethargy, altered mental status secondary to metabolic encephalopathy possibly due to hypernatremia and medications. Patient is on multiple psych medications due to schizophrenia and bipolar disorder and anxiety disorder. Initially in the ER there was no response to Narcan of his lethargy but now when I examined the patient  around 5:00 p.m. patient is fully alert and follows commands but he is not oriented. This may be his baseline mental status but his initial condition might be, as I mentioned earlier, might be because of hypernatremia or dehydration and medication effect. We will give him half normal saline and follow his BMP in the morning. Will hold all psychiatric medications as the valproic acid and lithium levels are on higher side. We would like to have neurology and psychiatry consult to readjust the medications as these abnormal movements appear to be most likely due to excessive psychiatric medications, might be just side effect of one of them. Will give Ativan 1 mg IV push every six hours p.r.n. for any anxiety. 2. CODE STATUS: FULL CODE.  TOTAL TIME SPENT: 50 minutes.   ____________________________ Hope Pigeon Elisabeth Pigeon, MD vgv:cms D: 02/27/2012 17:30:44 ET T: 02/28/2012 05:40:13 ET JOB#: [id number]  cc: Hope Pigeon. Elisabeth Pigeon, MD, <Dictator> Dr. Daphane Shepherd Dr. Corena Herter [place] MD ELECTRONICALLY SIGNED 03/03/2012 15:32

## 2014-09-27 NOTE — Consult Note (Signed)
Details:    - Psychiatry: Patient already seen and being followed. No indication to repeat entire consult. 1) His movement problems seem a lot better to me than when he came in. That is prob. largely from getting off the lithium. He is no longer shaking and is able to reach out and touch a target with more accuracy than when I first saw him. 2) He still can't sit up on his own. That may partly be from tardive dyskinesia and partly from his distended belly and general weakness. 3) He has fairly bad tardive dyskinesia. It is not likely to get much better. It is one reason to use clozapine instead of typical antipsychotics. 4) His clozapine level results came in this afternoon and his combined clozapine/norclozapine level is about 950. That is about double the usual target blood level. It is a likely cause of oversedation and also may contribute some to dizzyness by the extreme histamine blocking. I discontinued the am dose. This might keep him from being so sedated in the daytime.   Electronic Signatures: Clapacs, Jackquline DenmarkJohn T (MD)  (Signed 23-Sep-13 15:14)  Authored: Details   Last Updated: 23-Sep-13 15:14 by Audery Amellapacs, John T (MD)

## 2014-09-27 NOTE — Consult Note (Signed)
PATIENT NAME:  Randy Ferguson, Selah MR#:  [id number] DATE OF BIRTH:  1955-09-03  DATE OF CONSULTATION:  02/28/2012  REQUESTING PHYSICIAN:  Delfino LovettVipul Shah, M.D.  CONSULTING PHYSICIAN:  A. Wendall MolaMelissa Yong Wahlquist, MD  CHIEF COMPLAINT: Elevated TSH.   HISTORY OF PRESENT ILLNESS: This is a 59 year old male seen in consultation for high TSH level. Patient has a history of hypothyroidism, however, we do not have much outside records to review. It appears he was taking levothyroxine 125 mcg in June and it had been stopped sometime since that time. He lives at a group home and the medication list from the group home was sent and reviewed. Since 02/09/2012, he has not been receiving any levothyroxine. On presenting to the ED yesterday for some dizziness and imbalance, lab testing did show that his TSH level was over 100. The patient is a poor historian. He has schizophrenia and mental retardation. He is able to answer some questions, however, he is difficult to understand and provides mostly yes answers.   PAST MEDICAL HISTORY:  1. Schizophrenia.  2. Mental retardation.  3. Gastroesophageal reflux disease.  4. Hypothyroidism.  5. History of hepatitis C.  6. History of latent TBI.   PAST SURGICAL HISTORY: None reported.   ALLERGIES: No known drug allergies.   FAMILY HISTORY: Unable to obtain.   SOCIAL HISTORY: Patient lives at a group home called [facility], located here in [place].   MEDICATIONS: His medication list from that facility includes:  1. Clonazepam 100 mg 3 tabs q.a.m. and 5 tabs at bedtime.  2. Divalproex sodium 500 mg 2 tabs b.i.d.  3. Docusate sodium 100 mg 2 tabs daily.  4. Finasteride 5 mg daily.  5. Lithium 100 mg q.a.m. and 600 mg at bedtime.  6. Lorazepam 1 tab every six hours p.r.n. anxiety.  7. Loxapine 25 mg every 12 hours.  8. Omeprazole 20 mg daily.  9. Tamsulosin 0.4 mg daily.  10. Toviaz 8 mg daily.  11. Vitamin D 5000 units daily.   REVIEW OF SYSTEMS: Unable to obtain.    PHYSICAL EXAMINATION:  VITAL SIGNS: Temperature 97.5, pulse 78, respirations 20, blood pressure 137/99, oxygen saturation 100% on room air. Height 70.9 inches, weight 174 pounds/79 kg, BMI 24.4.   GENERAL: African American male who is calm and somewhat cooperative, no distress.   HEENT: He has moderate periorbital edema. He has significant swelling of the lower lip and mild swelling of the upper lip, possibly some tongue enlargement. Mucous membranes moist.   NECK: Supple. Mild thyromegaly without palpable nodules.   LYMPH: No submandibular or anterior cervical lymphadenopathy.   CARDIAC: Regular rate and rhythm without murmur.   PULMONARY: Clear bilaterally.   ABDOMEN: Diffusely distended, soft, nontender.   EXTREMITIES: No edema is present.    SKIN: No rash or dermatopathy is present.   MUSCULOSKELETAL: Normal motor tone throughout.   PSYCHIATRIC: Fairly flat affect, alert.   LABORATORY, DIAGNOSTIC AND RADIOLOGICAL DATA: Glucose 69, creatinine 1.43, sodium 149, potassium 4.1, chloride 115, CO2 28, calcium 9.2, ammonia level 60 (normal reference range 11 to 32), albumin 2.6, AST 45, ALT 37. TSH greater than 100. Lithium yesterday was 1.26, today is 1.03. Valproic acid yesterday was 102, today is 60. Hematocrit 32.7%, platelets 76.   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 Hypothyroidism is profoundly uncontrolled due to levothyroxine not being given for at least the  past several weeks and perhaps more. While hypothyroidism is likely contributing to his mental status, there are multiple other factors here as well including his liver failure, elevated ammonia level possibly with hepatic encephalopathy, as well as high lithium and valproic acid levels.   RECOMMENDATIONS: For hypothyroidism, I recommend  restarting his levothyroxine. Initial dose given can be higher then maintenance dose, such as 2 times higher. He does not currently have an IV in place and he has just had lunch, therefore, so I plan to give initial dose of 200 mcg IM one time today. Thereafter, will restart his outpatient dose of 125 mcg per day to be given each morning. I recommend a repeat TSH level in one week if he remains in-house in 2 to 4 weeks if done as an outpatient. I can set up an outpatient follow up appointment to manage this condition.    Thank you for the kind request for consultation. Please call if there are questions.   ____________________________ A. Wendall Mola, MD ams:cms D: 02/28/2012 13:45:36 ET T: 02/28/2012 14:05:07 ET JOB#: [id number]  cc: A. Wendall Mola, MD, <Dictator> Macy Mis MD ELECTRONICALLY SIGNED 03/05/2012 15:19

## 2014-09-30 NOTE — Consult Note (Signed)
Brief Consult Note: Diagnosis: Schizoaffective disorder, MR.   Patient was seen by consultant.   Consult note dictated.   Recommend further assessment or treatment.   Orders entered.   Comments: [name] came to the ER asking for a new placement as he feels that he is not liked enough in his group home. He is not suicidal, homicidal or aggressive. He is cool and collected and agrres to return to his group home. Dr. Garnetta Ferguson adjusted his medications.   PLAN: 1. The patient does not meet criteria for IVC. Please discharge as appropriate.   2. He is to continue all medications as in the community except: Abilify is discontinued. Risperdal Consta was given 9/1, next injection of Risperdal Consta 50 mg in two weeks. He is to take oral risperdal 3 mg twice daily until then. Rx provided.   3. His group home will pick him up.  Electronic Signatures: Randy Ferguson, Randy Ferguson (MD)  (Signed 03-Sep-14 11:00)  Authored: Brief Consult Note   Last Updated: 03-Sep-14 11:00 by Randy Ferguson, Randy Ferguson (MD)

## 2014-09-30 NOTE — Consult Note (Signed)
PATIENT NAME:  Randy Ferguson, Randy Ferguson MR#:  [id number] DATE OF BIRTH:  08-20-1955  DATE OF CONSULTATION:  02/10/2013  REFERRING PHYSICIAN:  Minna Antis, MD CONSULTING PHYSICIAN:  Jolanta B. Pucilowska, MD  REASON FOR CONSULTATION:  To evaluate an agitated patient.   IDENTIFYING DATA:  [name] is a 59 year old male with history of schizophrenia.   CHIEF COMPLAINT:  "I need my main pill."   HISTORY OF PRESENT ILLNESS:  [name] has a long history of mental illness.  He was brought to the Emergency Room from his group home where he was unruly and threatening to his peers.  He did not hurt anybody.  In the Emergency Room, the patient stated that he dislikes his group home.  He feels that he is not liked there and asked to find him another place.  The patient insisted that we find him a place in [place] where reportedly his daughter resides.  Unfortunately, there has been no family contact for a long time.  The patient in the Emergency Room behaved without any problems.  He was initially consulted by Dr. Guss Bunde and restarted on his regular regimen of medications.  The patient did not experience any problems in the Emergency Room and after initial slight resistance to return to his old group home, but he agreed to do so.  Of note, there were several substantial medication changes in the past month or so.  During his most recent hospitalization at [facility] in July of 2014, the patient was taken off clozapine.  He was getting 600 mg of clozapine daily.  He developed thrombocytopenia that was believed to be caused by Clozaril.  Clozaril was discontinued and the patient was started on Seroquel.  He was given 800 mg of Seroquel at the time of discharge along with Klonopin 1 mg twice daily and Cogentin.  He returns to the Emergency Room at the end of August on Seroquel 800 mg, Klonopin and Cogentin, but also Abilify 10 mg.  While in the Emergency Room we contacted Dr. Barnett Abu, his  current psychiatrist who insisted that the patient is switched to injectable antipsychotic.  Dr. Garnetta Buddy prescribed 25 mg of Risperdal Consta that was given on August 2nd.  It is not impossible that we were not able to find a substitute for clozapine.  The patient is currently on two antipsychotics and they may not be as good as Clozaril alone.  It is something to consider strongly if the patient continues to return to the Emergency Room.  The patient denies any symptoms of depression or psychosis.  He minimizes his problems at the group home.  Reportedly there is no history of substance use.   PAST PSYCHIATRIC HISTORY:  There were prior hospitalizations, at least twice at [facility].  There is history of lithium toxicity.   FAMILY PSYCHIATRIC HISTORY:  Unknown.   PAST MEDICAL HISTORY:  Constipation, benign prostatic hypertrophy, hypertension, hypothyroidism, GERD, vitamin D deficiency, thrombocytopenia, most likely due to Clozaril.   MEDICATIONS ON ADMISSION:  Abilify 10 mg daily, Amitiza 8 mcg twice daily, Cogentin 2 mg at bedtime, Klonopin 1 mg twice daily, Colace 100 mg twice daily, finasteride 5 mg daily, hydrochlorothiazide lisinopril 12.5/10 mg daily, hydroxyzine 25 mg daily, Synthroid 200 mcg daily, omeprazole 20 mg daily, Seroquel 800 mg at bedtime, Tamsulosin 0.4 mg daily, Toviaz 8 mg daily, vitamin D 5000 units daily.    MEDICATIONS AT THE TIME OF MY CONSULTATION:  Seroquel 100 mg in the morning and  at noon, 300 at bedtime.  Risperdal Consta 25 mg IM every two weeks injection given on 02/09/2013.  Abilify was discontinued.   SOCIAL HISTORY:  The patient lives in a group home.  There is no family contact.  He longs for his daughter and granddaughter who live in [place].  He has been at the group home for quite a bit and he is welcome back.   REVIEW OF SYSTEMS:  Difficult to obtain.  The patient does not complain of any physical pain.  He is asking for his "main pill,"  which I believe is Seroquel to help him sleep.   PHYSICAL EXAMINATION: VITAL SIGNS:  Blood pressure 129/63, pulse 77, respirations 18, temperature 98.6.  GENERAL:  This is an elderly gentlemen in no acute distress, looking older than the stated age.  The rest of the physical examination is deferred to his primary attending.   LABORATORY DATA:  Chemistries are within normal limits.  Blood alcohol level 0.  LFTs within normal limits except for alkaline phosphatase of 151.  TSH 0.427.  Urine tox screen positive for tricyclic antidepressants.  Interestingly on the 27th it was negative for Seroquel.  CBC within normal limits.  Urinalysis is not suggestive of urinary tract infection.  Serum acetaminophen and salicylates are low.   MENTAL STATUS EXAMINATION:  The patient is alert and oriented to person, place and somewhat situation.  He knows that it is August, two days off.  He is pleasant, polite and cooperative.  He is wearing hospital scrubs.  He is adequately groomed.  He maintains good eye contact.  His speech is soft.  There is paucity of speech.  Mood is fine with flat affect.  Thought process is logical, but there is paucity of thought.  He denies thoughts of hurting himself or others.  There are no delusions or paranoia.  The patient does not appear to attend to internal stimuli.  His cognition is difficult to assess, but appears grossly intact.  His insight and judgment are questionable.   DIAGNOSES: AXIS I:  Undifferentiated schizophrenia.  AXIS II:  Deferred.  AXIS III:  Constipation, benign prostatic hypertrophy, hypertension, hypothyroidism, gastroesophageal reflux disease, vitamin D deficiency.  AXIS IV:  Mental illness, primary support, conflict at the group home, too frequent medication changes.  AXIS V:  Global assessment of functioning at the time of discharge 45.   PLAN:   1.  The patient is not on IVC.  He does not meet criteria for involuntary inpatient psychiatric commitment.   Please discharge as appropriate.  2.  He is to continue all his medications as in the community except that his Abilify was discontinued.  Risperdal Consta given on 02/08/2013.  Next injection of Risperdal Consta in two weeks.  I would recommend that he is given 50 mg this time around because Risperdal Consta requires several biweekly injections, at least five to reach a steady state, a single dose of Risperdal is really meaningless.  Therefore the patient is to continue Risperdal 3 mg twice daily in addition to injectable.  His outpatient psychiatry will have to determine what to do next.  He is to continue Seroquel and I will increase his dose to 800 mg.  It is interesting that on initial drug screen he was negative for tricyclics which indicates that he was not taking Seroquel.  It is puzzling.  It is also not appropriate to change the medication in a patient with severe mental illness so frequency without a good reason.  There is no substitute for Clozaril.  Unfortunately, the patient is no longer able to tolerate it.  He may require several antipsychotic and sometimes we cannot find adequate substitute for Clozaril.  3.  He will follow up with Dr. Barnett AbuWiseman.  4.  His group home will pick him up.    ____________________________ Braulio ConteJolanta B. Jennet MaduroPucilowska, MD jbp:ea D: 02/10/2013 22:30:00 ET T: 02/10/2013 23:31:45 ET JOB#: [id number]  cc: Jolanta B. Jennet MaduroPucilowska, MD, <Dictator> Shari ProwsJOLANTA B PUCILOWSKA MD ELECTRONICALLY SIGNED 02/16/2013 6:30

## 2014-09-30 NOTE — Consult Note (Signed)
Brief Consult Note: Diagnosis: Schizoaffective disorder, MR.   Patient was seen by consultant.   Recommend further assessment or treatment.   Orders entered.   Comments: [name] came to the ER asking for a new placement as he feels that he is not liked enough in his group home. He is not suicidal, homicidal or aggressive. he does not meet criteria for IVC. He was restarted on medications inthe ER.  PLAN: 1. The patient does not meet criteria for IVC. He is a placement problem.  2. His guardian, [facility], requested [facility]. This is impossible to do w/o IVC.  3. He will continue medications.   4. We will look for placement.  Electronic Signatures: Kristine LineaPucilowska, Daman Steffenhagen (MD)  (Signed 01-Sep-14 23:39)  Authored: Brief Consult Note   Last Updated: 01-Sep-14 23:39 by Kristine LineaPucilowska, Eshal Propps (MD)

## 2014-09-30 NOTE — Consult Note (Signed)
Brief Consult Note: Diagnosis: Schizophrenia, Chronic Paranoid Type.   Patient was seen by consultant.   Recommend further assessment or treatment.   Orders entered.   Discussed with Attending MD.   Comments: Pt seen in ED. He continues to remain psychotic and has been showing poor insight and remains delusional and psychotic. he was noted to be talking about "knots in his abdomen ". I asked about constipation, but he was not clear on the same. he was noted to be responding to internal stimuli. He was also noted to drooling on his bed. he denied Si/HI or plans.   Plan: I will order flat plate abbdominal Xray to r/o abdominal obstruction vs constipation.  [facility] once he is medically stable.  I will decrease the dose of Clozaril to 100 mg in am and 400mg  qhs.  Add Cogentin 2mg  qhs to control drooling although it can worsen constipation as well.  Will continue to monitor him closely.  Treatment team to monitor.  Electronic Signatures: Rhunette CroftFaheem, Sherrina Zaugg S (MD)  (Signed 17-Jul-14 15:20)  Authored: Brief Consult Note   Last Updated: 17-Jul-14 15:20 by Rhunette CroftFaheem, Bradie Sangiovanni S (MD)

## 2014-09-30 NOTE — Consult Note (Signed)
Brief Consult Note: Diagnosis: schizophrenia.   Patient was seen by consultant.   Consult note dictated.   Discussed with Attending MD.   Comments: Psychiatry: Patient seen. Chart reviewed. Chronic schizophrenia was a bit agitated at outpt appointment today but not threatening. No SI. Mental state at baseline. Not needing inpt admission. Group home will take him back. [facility]  Electronic Signatures: Audery Amellapacs, Alexandro Line T (MD)  (Signed 27-Aug-14 17:43)  Authored: Brief Consult Note   Last Updated: 27-Aug-14 17:43 by Audery Amellapacs, Qamar Rosman T (MD)

## 2014-09-30 NOTE — Consult Note (Signed)
PATIENT NAME:  [name], Daiquan MR#:  [id number] DATE OF BIRTH:  11/01/1955  AGE:  59 years  SEX:  Male  RACE:  African-American  DATE OF CONSULTATION:  09/26/2012  PLACE OF DICTATION:  [facility] - [facility], [facility], [place], [place]   CONSULTING PHYSICIAN:  Gwendolin Briel K.  Ducre, MD  The patient was seen in consultation. I came to see him, but he was sleeping, so under singned  had to come back again.   SUBJECTIVE:  The patient lives in a group home. He has a long history of mental illness, and  every holiday becomes obsessed with finding his family in [place],  and he gets very upset.  CHIEF COMPLAINT:  "I had problems at the group home because of payments, and cigarettes and food."  HISTORY:  The patient was reassured that he has a disability check which pays for his stay, and he agreed to the same. According to the information obtained from the group home, just before the holiday season he becomes depressed and agitated, insisting that he wants to return home to [place] to be with his family.  Other residents of the group home started complaining about him because of his poor hygiene and not taking a bath.   OBJECTIVE:  The patient was seen lying in bed. He is dressed in hospital clothes. Alert and oriented. Aware of the situation that [facility]. Admits that he feels depressed and feels low and down during holiday season. Admits feeling hopeless and helpless about the situation and wanting to go back to his family to be with the family for Easter season. Does admit to history of depression, but denies any ideas to hurt himself or others.  Denies any psychotic thinking. Denies auditory or visual hallucinations. Memory is intact. Cognition is average. Insight and judgment guarded.  IMPRESSION:  Major depressive disorder, recurrent. Anxiety State NOS.  PLAN:  Continue current medication. Consider discharge back to the group home on 09/29/2012 after Odis Lusteraster is gone so that by that time, the  patient will be stable enough to go back to the group home. Will try to contact family by telephone to see if they will come visit him in August, which happens to be his birthday.   ____________________________ Jannet MantisSurya K. Guss Bundehalla, MD skc:mr D: 09/26/2012 20:57:42 ET T: 09/26/2012 22:49:06 ET JOB#: [id number]  cc: Monika SalkSurya K. Guss Bundehalla, MD, <Dictator> Beau FannySURYA K Kaydence Baba MD ELECTRONICALLY SIGNED 09/27/2012 18:26

## 2014-09-30 NOTE — Consult Note (Signed)
Brief Consult Note: Diagnosis: Schizoaffective disorder, MR.   Patient was seen by consultant.   Recommend further assessment or treatment.   Orders entered.   Comments: [name] came to the ER asking for a new placement as he became more agitated and he was yelling and screaming in the group home and wants to move to new place. he appeared more calm today and was noted to have significant EP movements espcially in his face and tongue and was constantly moving his tongue around. he stated that it is due to the medications. he was also noted to be restless. He was also agreed to [facility].   PLAN: 1. The patient does not meet criteria for IVC. He is a placement problem.  2. His guardian, Clare ARK,- Karle Plumbererry Young will be notified of the change in medication as follows.  He will started on Rispardal Consta 25mg  IM q2 weeks- First dose given today.  Decrease Seroquel 100mg  po BID and 300mg  po qhs.  D/C Abilify.  Add Benadryl 25mg  po q6hrs.  Cogentin 2mg  po qhs.  Klonopin 1mg  po TID  Will continue to monitor.  Electronic Signatures: Rhunette CroftFaheem, Abdelrahman Nair S (MD)  (Signed 02-Sep-14 11:45)  Authored: Brief Consult Note   Last Updated: 02-Sep-14 11:45 by Rhunette CroftFaheem, Cathye Kreiter S (MD)

## 2014-09-30 NOTE — Discharge Summary (Signed)
PATIENT NAME:  Randy Ferguson, Randy Ferguson MR#:  [id number] DATE OF BIRTH:  [birth year]  DATE OF ADMISSION:  12/24/2012 DATE OF DISCHARGE:  01/05/2013  HOSPITAL COURSE:  See dictated history and physical for details of admission.  This 59 year old man with a history of schizophrenia was brought to the hospital after becoming oppositional about going back to his group home.  It was reported that he had been refusing his medication briefly and that when he does so he will frequently get paranoid and oppositional and become fixated on his delusion that he has family that he can go to live with.  In the hospital, the patient did not show any suicidal or violent behavior.  For the most part, he was passively cooperative with ward routine.  He attended some groups, but his cognitive processing level remained fairly low.  Initially he was maintained on his clozapine that he had been taking previously.  The group home had assured [place] that as long as he took his clozapine regularly he would return to his baseline mental state.  Unfortunately, his laboratory tests showed an extremely low platelet count below 50 at one point.  I asked for an internal medicine consult and they felt that the most likely cause was the clozapine.  I think I would have to agree.  Because of that, we discontinued his clozapine and he was started on Seroquel.  He was gradually titrated up on Seroquel to a fairly high dose by the time of discharge.  He appeared to tolerate the medicine well, did not show any violent behavior, however in interview every day the patient would state that he was not going to go back to the group home, but intended instead to go stay with his family.  At times he would become quite irritable with me discussing it.  Eventually staff felt that he had calmed down enough that we could try to simply discharge him.  His group home manager came to visit him and apparently they had a good visit.  The patient was discharged back to the group  home without incident.  He continued to have psychotic symptoms, but was probably about at his baseline in terms of mental functioning.  He was still taking the Seroquel and seemed to probably be doing about as well with that as he had with the clozapine.   DISCHARGE MEDICATIONS:  Finasteride 5 mg once a day, tamsulosin 0.4 mg once a day, hydrochlorothiazide lisinopril combination 12.5/10 mg once a day, Cogentin 2 mg at night, Seroquel 800 mg at night, clozapine 1 mg twice a day, Amitiza 8 mcg twice a day, docusate 100 mg twice a day, omeprazole 20 mg once a day, levothyroxine 200 mcg once a day, vitamin D 5000 units once a day.   LABORATORY RESULTS:  The patient had a CBC done on July 18th which was most notable for a platelet count of 62.  Labs done on the 19th showed chemistry profile with glucose elevated at 137, chloride elevated 108, alkaline phosphatase elevated 154, albumen low 3.3.  CBC showed a platelet count still low at 66.  Prothrombin time 12.3.  APF tumor marking 1.9, which is in the normal range.  Platelet count was 75 on July 20th.  The really low one was on July 15th at 37.  He was gradually therefore improving and was up to nearly 100 by the time he left.   MENTAL STATUS EXAMINATION AT DISCHARGE:  A somewhat disheveled gentleman, looks his stated age.  Passively  cooperative.  Eye contact intermittent.  Psychomotor activity slow.  Speech slow and decreased in total amount.  Affect blunted.  Mood stated as okay.  Thoughts appear very slowed, somewhat disorganized, still a little bit paranoid.  Denied hallucinations.  Denied suicidal or homicidal ideation.  Judgment and insight are poor.  He continues to have delusions that his family are waiting for him somewhere for him to come live with them.  Intelligence level low.  Alert and oriented basically.   DISPOSITION:  Discharge home to his group home.  Follow up with [facility].   DIAGNOSIS, PRINCIPAL AND PRIMARY:  AXIS I:  Schizophrenia,  undifferentiated.   SECONDARY DIAGNOSES: AXIS I:  No further.  AXIS II:  Deferred.  AXIS III:  Hypothyroidism, vitamin D deficiency, gastric reflux symptoms, irritable bowel syndrome, high blood pressure, prostate dysfunction and thrombocytopenia.  AXIS IV:  Moderate from burden of his chronic illness.  AXIS V:  Functioning at time of discharge 45.     ____________________________ Audery AmelJohn T. Clapacs, MD jtc:ea D: 01/07/2013 15:48:00 ET T: 01/07/2013 19:31:33 ET JOB#: [id number]  cc: Audery AmelJohn T. Clapacs, MD, <Dictator> Audery AmelJOHN T CLAPACS MD ELECTRONICALLY SIGNED 01/10/2013 18:31

## 2014-09-30 NOTE — Consult Note (Signed)
Brief Consult Note: Diagnosis: Schizoaffective disorder, MR.   Patient was seen by consultant.   Consult note dictated.   Recommend further assessment or treatment.   Orders entered.   Comments: [name] wastreated with Clozapine with great success. This was discontinued recently due to severe thrombocytopenia. He was switched to seroquel with inadequate response. He came to the ER agitated. Severeal attempts to adjust his medications failed. I restarted lower dose Clozapine in addition to seroquel with some success. i realizwe that this is not the most rational combination, increases risk of seizures for example, but I did not want to make more adjustments. The patient appears to be tolerating medications well. No behavioral problems.   Plan: 1. Will continue a combination of seroquel and Clozaril.   2. Will check platelets.   3. The patient is not appropriate for our BMU.   4. Anticipated discharge to his group home on Monday.  Electronic Signatures: Kristine LineaPucilowska, Jolanta (MD)  (Signed 07-Sep-14 18:10)  Authored: Brief Consult Note   Last Updated: 07-Sep-14 18:10 by Kristine LineaPucilowska, Jolanta (MD)

## 2014-09-30 NOTE — H&P (Signed)
PATIENT NAME:  Randy Ferguson, Randy Ferguson MR#:  [id number] DATE OF BIRTH:  Nov 30, 1955  DATE OF ADMISSION:  12/24/2012  IDENTIFYING INFORMATION AND CHIEF COMPLAINT:  A 59 year old man with schizophrenia who was brought to the Emergency Room after becoming uncooperative at his treatment facility.  The patient did not have a chief complaint to me.   HISTORY OF PRESENT ILLNESS:  Information obtained from the patient and the chart.  The patient evidently became disruptive at least to the extent of refusing to [facility] July [facility].  He was brought to the Emergency Room for evaluation.  History was that he had been refusing his clozapine and possibly other medicines for probably just a few days.  The patient at that time was disorganized in his thinking and not able to give any coherent history.  The group home did report that when he stops using his medication he will frequently get paranoid and then in fact he had started to express disorganized, paranoid, psychotic ideas accusing people of raping his family.  The patient has not, as far as we know, been abusing substances.  We do not know of any other particular new stress.  The patient himself is not able to give much in the way of useful history.  He is disorganized in his thinking to the point of being nearly word salad.   PAST PSYCHIATRIC HISTORY:  The patient evidently has a long-standing history of mental health disorders, probably schizophrenia, although he was first seen at our facility late 2013.  At that time, he had been admitted to the hospital with lithium toxicity which gradually improved in the hospital.  Since then he has had some visits to the Emergency Room, but this appears to be the first time he was actually admitted to the psychiatry ward.  The staff report that he periodically becomes noncompliant with his medication and will usually develop psychotic thinking.  On at least one previous occasion he had run  away from his group home trying to get to [place] where he delusionally believed that he could reach his family.  It is not known whether he has a history of suicide attempts or violence.  His current medication is primarily clozapine 100 mg in the morning and 500 mg at night, also clonazepam twice a day and some other medicines for hypothyroidism and bladder spasticity.  He does not appear to be on any other mood stabilizers currently.  In the past, he had been on lithium as well which became toxic and developed problems for him.   PAST MEDICAL HISTORY:  The patient evidently has bladder problems and hypothyroidism as well as chronic constipation and gastric reflux symptoms.   SOCIAL HISTORY:  The patient lives at a group home in [place].  The report is that he has not seen or communicated with his family in years and they do not seem to be actively involved in his treatment.  The guardianship status is currently unknown to me.   SUBSTANCE ABUSE HISTORY:  No history report of substance abuse problems.  The patient is not able to coherently answer questions about that right now.   FAMILY HISTORY:  Unknown.   REVIEW OF SYSTEMS:  The patient complains of feeling like people are out to get him and are trying to hurt him.  He indicates vaguely that he is hearing things or getting signals from somewhere, but it is nonspecific.  He does not have any particular description of  his mood.  He tells me that he feels like he has got an unpleasant feeling in his abdomen, but he admits that he had a bowel movement today.  He does not follow up on any of that and does not have any other physical symptoms.   CURRENT MEDICATIONS:  Vitamin D 5000 units a day, Klonopin 1 mg twice a day, Clozaril 100 mg in the morning and 500 mg at night, docusate 100 mg twice a day, Proscar 5 mg a day, Synthroid 0.2 mg per day, Prilosec 20 mg per day, Flomax 0.4 mg per day.   ALLERGIES:  No known drug allergies.   MENTAL STATUS  EXAMINATION:  Disheveled gentleman, poor hygiene and poor self-care.  Passively cooperative with the interview.  Eye contact intermittent.  Psychomotor activity a little bit fidgety.  There is some sign of possibly still having some tardive dyskinesia.  The patient has slurred speech and is difficult to understand.  Also speaks rapidly and with an irregular rate.  His mood is stated as being all right.  His affect appears constricted and intense.  His thoughts are grossly disorganized and loose.  He is pan delusional.  Appears to be having auditory hallucinations and responding to internal stimuli.  Judgment and insight poor.  Intelligence probably low average to average at baseline, but impaired by psychosis.  Unable to even tell if he is fully alert and oriented to his situation.   PHYSICAL EXAMINATION: SKIN:  No acute skin lesions identified.  HEENT:  Pupils equal and reactive.  Face appears to be slightly misshapen.  He has a big scar on his chin.  He cannot tell me where that came from.  He looks like he either has some congenital misshapement to his head or else was in some accident at some time.  His pupils are equal and reactive.   NEUROLOGIC:  The patient has a normal gait.  Full range of motion at all extremities.  Strength and reflexes normal and symmetric throughout.  Cranial nerves symmetric and normal.    LUNGS:  Clear without wheezes.  HEART:  Regular rate and rhythm.  ABDOMEN:  Soft, nontender, normal bowel sounds.  VITAL SIGNS:  Temperature 98 degrees, pulse 73, respirations 20, blood pressure 172/102.   LABORATORY RESULTS:  Drug screen negative.  Abdomen x-ray showed no clear sign of constipation.  There was a note that there is a heterogeneous density in the right iliac bone of unclear type.  Urinalysis normal.  TSH normal at 0.6.  Alcohol undetected.  Chemistry showed an elevated glucose 118, chloride elevated at 113.  CBC showed a very low platelet count at 27 of unclear cause.     ASSESSMENT:  A 59 year old man with schizophrenia, currently grossly disorganized.  Psychotic, unable to communicate, poor insight.  Probably from being off his medication.  The patient requires hospitalization for evaluation and treatment.  Additionally, he is currently showing a severe thrombocytopenia which needs to be evaluated.   TREATMENT PLAN:  Admit to psychiatry.  Restart clozapine.  Restart other meds.  Engage in group and activities.  We need to recheck his CBC and get some consultation probably about what is going on with that.  Try and form rapport with him.  Stay in contact with the group home, hopefully get him back to a place where he will be compliant with going to his group home.   DIAGNOSIS, PRINCIPAL AND PRIMARY:  AXIS I:  Schizophrenia, undifferentiated.   SECONDARY DIAGNOSES: AXIS I:  No further diagnosis.  AXIS II:  Deferred.  AXIS III:  Prostate disease, hypothyroidism, thrombocytopenia of unclear etiology.  AXIS IV:  Severe from lack of support, isolation, chronic mental illness.  AXIS V:  Functioning at time of evaluation is 30.     ____________________________ Audery Amel, MD jtc:ea D: 12/25/2012 17:09:44 ET T: 12/25/2012 17:38:44 ET JOB#: [id number]  cc: Audery Amel, MD, <Dictator> Audery Amel MD ELECTRONICALLY SIGNED 12/28/2012 9:40

## 2014-09-30 NOTE — Consult Note (Signed)
PATIENT NAME:  Randy Ferguson, Randy Ferguson MR#:  [id number] DATE OF BIRTH:  01-07-1956  DATE OF CONSULTATION:  12/25/2012  CONSULTING PHYSICIAN:  [place] Sink, MD  REQUESTING PHYSICIAN:  Dr. Weber Cooks   REASON FOR CONSULTATION:  Severe thrombocytopenia.  PRIMARY CARE PHYSICIAN:  Dr.  Prudence Davidson  PSYCHIATRIST:   Dr. Sammuel Cooper   CHIEF COMPLAINT:  For hospitalization; apparently the patient was brought to the Emergency Department because he was uncooperative at his treatment facility.   HISTORY OF PRESENT ILLNESS:  Randy Ferguson is a nice 59 year old gentleman, with history of schizophrenia. He also has history of bipolar disorder, mild mental retardation, reflux, chronic constipation, hypothyroidism, hepatitis C, history of latent tuberculosis infection, with negative testing in 2012, and history of being treated for syphilis in the past. The patient and I visited  together, but the patient at this moment is a little bit delusional. He is nice, cooperative, but due to his mental retardation delusions, he is not able to provide good information. Most of the information has been obtained by the chart. Apparently the patient became disruptive to the extent of refusing to leave from to [facility], where he gets his treatments, on July 15, for what he was brought to the Emergency Department. He has been refusing to take his clozapine and other medications for several days. He had very disorganized thinking. He was actually saying that somebody was raping his family. He became psychotic and paranoid, and had very difficult communication because he is having such a word salad and disorganized thinking. The patient has been evaluated by Dr. Weber Cooks, and his doses of medications have been increased. Apparently his clozapine has also been increased in the last couple of days. He had a CBC done today, showing white blood cells coming down at 5.4 from previous 6, and his platelet count of 27,000. Reviewing his chart, in  the past his white blood cells have been as low as 4, his platelets have been as low as 74, but they resolved. In April 2014, they were under 100,000, 194, and 200,000. Today, again they are 27. The patient has no other complaints.   REVIEW OF SYSTEMS: Unable to obtain a full review of systems due to patient's paranoia and word salad and disorganized thinking.   PAST MEDICAL HISTORY: 1.  Schizophrenia.  2.  Bipolar disorder.  3.  Mental retardation.  4.  GERD.  5.  Chronic constipation.  6.  Hypothyroidism.  7.  Hepatitis C.  8.  History of latent tuberculosis.  9.  History of syphilis.   PAST SURGICAL HISTORY:  None.   ALLERGIES:  No known drug allergic reactions.   FAMILY HISTORY: The patient is at a group home, and he is not able to give me any information for what  is unknown. History of substance abuse, not reported in the past.    SOCIAL HISTORY:  The patient lives in a group home. He smokes 10 cigarettes a day. He does not drink. He does not communicate with his family, he does not get along with him.   CURRENT MEDICATIONS:  Clozapine 100 mg in the morning, acetaminophen as needed, magnesium, aluminum plus as needed, Cogentin 2 mg at bedtime, cholecalciferol 5000 units once daily, clonazepam 1 mg twice daily, Docusate 100 mg twice daily, finasteride 5 mg daily, levothyroxine 0.2 mg daily, lorazepam 2 mg as needed for agitation, milk of magnesia as needed for constipation, nicotine oral inhaler kit, omeprazole 20 mg every morning, Flomax 0.4 mg every day, clozapine  500 mg at bedtime.   PHYSICAL EXAMINATION: VITAL SIGNS:  Blood pressure 172/102, pulse 73, respirations 20, temperature 98.   GENERAL: The patient is alert. He has a very convoluted speech, disorganized speech and thinking, with word salad. He is cooperative, and he occasionally communicates well, but some rare occasions he starts talking really fast, and is intelligible. The patient is hemodynamically stable.  HEENT:  Pupils are equal and reactive. Extraocular movements are intact. Mucosa are moist. Anicteric sclerae. Pink conjunctivae. No oral lesions. No oropharyngeal exudates.  NECK: Supple. No JVD. No thyromegaly. No adenopathy. No carotid bruits. No rigidity. Trachea central.  CARDIOVASCULAR:  Regular rate and rhythm. No murmurs, rubs or gallops.  LUNGS:  Clear, without any wheezing or crepitus. No use of accessory muscles.  ABDOMEN:  Distended, soft, nontender. No hepatosplenomegaly is palpable. I believe patient has ascites. We are going to get an ultrasound to evaluate that.  GENITAL:  Exam deferred.  EXTREMITIES:  No edema, cyanosis or clubbing. Pulses +2. Capillary refill less than 3.  NEUROLOGIC:  Cranial nerves II through XII intact. Strength is 5/5 in all 4 extremities. Ambulation is normal, without any ataxia or gait abnormalities.  PSYCHIATRIC: As mentioned above, patient has delusional ideas, disorganized thinking, disorganized, very fast, word salad speech occasionally, on and off.  LYMPHATIC:  Negative for lymphadenopathy in neck or supraclavicular areas.  SKIN: No rashes or petechiae observed. His platelets are 22,000, but I did not see any petechiae, that could be related to the color of his skin. The patient is not letting me do a full skin assessment, as he is a little bit paranoid.  MUSCULOSKELETAL:  No joint effusions or joint abnormalities.  VASCULAR:  Pulses +2. Capillary refill less than 3.  LABS:  Platelets 22,000. Urinalysis overall normal, 4 white blood cells, 5 red blood cells. Platelet count 27,000, hemoglobin 14, white count 5.4. UDS is negative. Alkaline phosphatase is 144, AST and ALT on the 15th are normal. Glucose is 118, creatinine 1.03.   ASSESSMENT AND PLAN:  A 59 year old gentleman with schizophrenia, admitted to the hospital, not taking his clozapine. Now he has very low platelets.   1.  Schizophrenia. The patient is admitted for treatment of this condition. He is  managed by Dr. Weber Cooks, trying to get him more stable.   2.  Thrombocytopenia. The patient has thrombocytopenia, which could be multifactorial, but since he is on clozapine, we are going to recommend to stop that. Review of multiple studies shows that platelet dysfunction and thrombocytopenia rarely occur during clozapine therapy, but when it occurs constitutes  an important source of morbidity and mortality, and therapy should be discontinued. Most of the studies show that after discontinuation of therapy, platelets go back to normal.   3.  At this moment, patient also has other possibilities, liver disease and hypersplenism, for what we are going to do an ultrasound. Even if there is a cause secondary to this problem clozapine could be creating an add-on factor to it, for what will be best to discontinue. I discussed the case  with Dr. Weber Cooks, who states that he is going to look up other options for treatment.   4.  Get a PT/INR, monitor for bleeding. The patient denies any significant active bleeding  at this moment, and he is hemodynamically stable.   5.  Hepatitis C. Since the patient has not been checked by his doctor, we have to do workup for hematoma, ultrasound of liver, and alpha-fetoprotein.   6.  Hypertension. The patient does not carry a diagnosis of hypertension, does not take medications for hypertension. We are going to start him on clonidine p.r.n.   7.  Gastrointestinal prophylaxis. The patient is on omeprazole. Omeprazole could also be a cause of thrombocytopenia, leukopenia, for what we are just going to monitor closely.   8.  Hematology consultation to be obtained.   I spent about 50 minutes reviewing this case and evaluating the patient.     ____________________________ Fredonia Sink, MD rsg:mr D: 12/25/2012 19:19:00 ET T: 12/25/2012 21:26:25 ET JOB#: [id number]  cc: [place] Sink, MD, <Dictator>  America Brown MD ELECTRONICALLY SIGNED  01/03/2013 0:37

## 2014-09-30 NOTE — Consult Note (Signed)
Brief Consult Note: Diagnosis: Schizoaffective disorder, MR.   Patient was seen by consultant.   Consult note dictated.   Recommend further assessment or treatment.   Orders entered.   Comments: [name] returns to the ER delusional. He was apparently compliant with medications. His psychiatrist, Dr. Barnett AbuWiseman requests that the patient be treated with injectable Abilify to improve compliance. There is no allergy to Abilify. The patient did well on Clozapine but developed thrombocytopenia. Seroquel was uinitiated instead. with great success. This was discontinued recently due to severe thrombocytopenia. He was switched to seroquel but response was inadequate.     Plan: 1. Will continue to cross taper Seroquel to Clozaril. He has been tolerating 200 mg with no problem and robust platelets.   2. Will give 400 mg of abilify maintena per primary psychiatrist request. Will give oral abuilify for 2 weeks.   3. Will continue all other medications as prescribed by his PCP.   4. Will monitor and transfer to psychiatry when beds available.  Electronic Signatures: Kristine LineaPucilowska, Jolanta (MD)  (Signed 16-Sep-14 14:21)  Authored: Brief Consult Note   Last Updated: 16-Sep-14 14:21 by Kristine LineaPucilowska, Jolanta (MD)

## 2014-09-30 NOTE — Consult Note (Signed)
Brief Consult Note: Diagnosis: Schizoaffective disorder, MR.   Patient was seen by consultant.   Consult note dictated.   Recommend further assessment or treatment.   Orders entered.   Comments: Pt seen in ER. he appeared more calm and he stated that he is doing better now. however, he is waiting on his family to come and pick him and does not want to return to the same group home. He was given Risperdal Consta which he tolerated well and no adverse effects noted. he denied any mood swings, paranoia and agitation. Has poor insight into his illness..   Plan: Will Decrease Risperdal 1mg  po BID due to EPS and acute dystonic reactions  in his mouth and face.  Will d/c Seroquel 100mg  po bid and continue with 600mg  at bedtime.  He stated that he will think about d/c to the grouphome.  Awaiting disposition.  Electronic Signatures: Rhunette CroftFaheem, Emiya Loomer S (MD)  (Signed 04-Sep-14 10:08)  Authored: Brief Consult Note   Last Updated: 04-Sep-14 10:08 by Rhunette CroftFaheem, Jamill Wetmore S (MD)

## 2014-09-30 NOTE — Consult Note (Signed)
Brief Consult Note: Diagnosis: Schizophrenia, Ch PT.   Patient was seen by consultant.   Orders entered.   Discussed with Attending MD.   Comments: Pt seen in ED. He has shown significant improvement and does not show any paranoia and delusional thinking. He stated that the "blue pill" makes him very tired. However, he is doing better since the dose was decreased yesterday. He is more alert and calm. He agreed to take the medications and will not run away form the group home.  Call made to his group home and they are willing to take him back at this time. Pt denied SI/HI or plans.   Plan: Will D/C IVC.  Will give him prescriptions for all his medications including Clozaril 400 mg po qhs.  Follow up with out pt provider.  He will be discharged as he is clinically stable.  Electronic Signatures: Rhunette CroftFaheem, Tawnya Pujol S (MD)  (Signed 27-Apr-14 12:40)  Authored: Brief Consult Note   Last Updated: 27-Apr-14 12:40 by Rhunette CroftFaheem, Cornell Bourbon S (MD)

## 2014-09-30 NOTE — Consult Note (Signed)
PATIENT NAME:  Randy Ferguson, Randy Ferguson MR#:  [id number] DATE OF BIRTH:  [birth year]  DATE OF CONSULTATION:  12/22/2012  CONSULTING PHYSICIAN:  Izola Price. Jaclynn Major, MD  CHIEF COMPLAINT: "I've got to get my love pills, my medicines. [birth year]. I've got to get the medicines to fill me with the glory. I'm afraid he's going to hurt me, knock me down and kill me. The Lord ain't nobody's fool."   SUMMARY: Randy Ferguson is a 59 year old African American male with a diagnosis of schizoaffective disorder. He comes to the ED today after the group home manager, Anthony Sar, called the police when Randy Ferguson refused to leave the program at 2:30. He also was accusing people of having sex with his daughter and spying him. Randy Ferguson went to the magistrate to take out IVC papers. He reports that Randy Ferguson gets like this about every 3 months. He is refusing his Clozaril and Randy Ferguson asked that we "see if he will take the medicine." Randy Ferguson also reports that he has an appointment with his doctor, Dr. Barnett Abu at [facility], on Friday 12/25/2012 and that it would be great if Randy Ferguson could get to that appointment.   We are getting in touch with his guardian, Karle Plumber. We have left a message.   Suicidal risk is believed to be very low. He denies any history of suicide attempts. He reports a family history of mental illness in the family but will not elaborate.   He denies any history of homicide or violence. He denies any homicidal ideation, intent or plan.   ALCOHOL AND DRUG USE: None.   MENTAL STATUS EXAM: Randy Ferguson is a disheveled, anxious-appearing male who is tangential and circumferential. He is unable to concentrate and is guarded and regressed. He is labile and agitated and reports recent mood changes. He is delusional, feeling that people are trying to kill him and are having sex with his daughter. His thought processes are nonlinear,  nonlogical, not goal oriented. He is  hypervigilant. His speech is pressured and at times loud. He has minimal insight and poor judgment.   SOCIAL HISTORY: Randy Ferguson lives at [facility] group home. Manager, Anthony Sar, [phone]. Randy Ferguson is perfectly willing for Randy Ferguson to come back if he gets stabilized on his medications.   FAMILY SUPPORT: He has no family involvement.   MEDICATIONS: Docusate sodium 100 mg p.o. b.i.d., Amitiza 8 mcg p.o. b.i.d., clozapine 100 mg p.o. q.a.m. and 500 mg p.o. at bedtime, clonazepam 1 mg p.o. b.i.d., hydroxyzine pamoate 25 mg p.o. q.i.d. as needed for itching, levothyroxine 200 mcg p.o. daily, tamsulosin 0.4 mg p.o. daily, finasteride 5 mg p.o. daily, Toviaz 8 mg p.o. daily, omeprazole 20 mg p.o. daily, vitamin D3 at 5000 international units p.o. daily.   ALLERGIES: No known drug allergies.   PAST MEDICAL HISTORY: He has a history of lithium toxicity hypothyroidism, BPH and vitamin D physician deficiency.   DIAGNOSES: AXIS I: Schizophrenic, chronic paranoid type.  AXIS II: None.  AXIS III: History of lithium toxicity,  hypothyroidism, benign prostatic hypertrophy and vitamin D deficiency.   PLAN: Randy Ferguson will be monitored in the ED and will be restarted back on his medications. I will start Clozaril 100 mg p.o. q.a.m. and 500 mg p.o. at bedtime. A CBC with diff has been done and is within normal limits.   ____________________________ Izola Price Jaclynn Major, MD fcg:jm D: 12/23/2012 13:55:55 ET T: 12/23/2012 15:31:41 ET JOB#: [id number]  cc: Izola PriceFrances C. Jaclynn MajorGreason, MD, <Dictator> Maryan PulsFRANCES C Torin Whisner MD ELECTRONICALLY SIGNED 12/23/2012 16:41

## 2014-09-30 NOTE — Consult Note (Signed)
Brief Consult Note: Diagnosis: THROMBOCYTOPENIA.   Comments: DICTATED NOTE TO FOLLOW... NOTE PATIENT SEEN  AFTERNOON OF 12/31/12 .Marland Kitchen.  NO ACUTE COMPLAINTS,  EXAM BENIGN,  APPARENT MEDICATION EFFECT AS PLTS IMPROVING, BUT NOW PLATEAU. SPLEEN NORMAL SIZE. LIKELY OTHER MEDICATION EFFECT, AND/OR LOW GRADE ITP, LESS LIKELY MDS OR PRIMRY BM DISEASE, MOST LIKELY EFFECT OF HEP C WHICH IS REPORTED BY HX....Marland Kitchen.PLAN  CHECK HEP C AND HIV AND SERIAL CBC.  WILL FOLLOW.  Electronic Signatures: Marin RobertsGittin, Robert G (MD)  (Signed 25-Jul-14 08:47)  Authored: Brief Consult Note   Last Updated: 25-Jul-14 08:47 by Marin RobertsGittin, Robert G (MD)

## 2014-09-30 NOTE — Consult Note (Signed)
PATIENT NAME:  Randy Ferguson, Randy Ferguson MR#:  [id number] DATE OF BIRTH:  May 23, 1956  DATE OF CONSULTATION:  02/06/2013  REFERRING PHYSICIAN:   CONSULTING PHYSICIAN:  Olando Willems K. Guss Bundehalla, MD  PLACE OF DICTATION:  [facility] [address], [place].  AGE:  59 years.  SEX:  Male.  RACE:  African American.  SUBJECTIVE:  The patient was seen in consultation in [facility] at [facility].  The patient is a 59 year old African American male with a long history of paranoid schizophrenia, MR and was paranoid towards the staff at the group home and refuses to return there.  According to information obtained the patient had been living at the group home for quite some time and he started having arguments with the people around and staff members.   OBJECTIVE:  The patient was seen in his room.  He is alert and he stated that his date of birth was 1957 August.  He knew this was August, but could not name the date.  He admits feeling paranoid and hearing auditory hallucinations.  He is psychotic and is hard to redirect.  Insight and judgement impaired.   IMPRESSION:  Schizophrenia, paranoid, chronic paranoid in exacerbation, mild mental retardation.   RECOMMENDATIONS:  Start the patient back on his medications and adjust the same for better control of his psychosis.  [name] is to contact the group home on Monday, that is 02/08/2013, and see if he can go back to the same.    ____________________________ Jannet MantisSurya K. Guss Bundehalla, MD skc:ea D: 02/06/2013 20:42:15 ET T: 02/07/2013 00:21:25 ET JOB#: [id number]  cc: Monika SalkSurya K. Guss Bundehalla, MD, <Dictator> Beau FannySURYA K Liborio Saccente MD ELECTRONICALLY SIGNED 02/07/2013 17:39

## 2014-09-30 NOTE — Consult Note (Signed)
PATIENT NAME:  Randy Ferguson, Randy Ferguson MR#:  [id number] DATE OF BIRTH:  1955-07-23  DATE OF ADMISSION:  10/02/2012  DATE OF CONSULTATION:  10/03/2012  CONSULTING PHYSICIAN:  Jamara Vary S. Garnetta Buddy, MD  REQUESTED BY:  ED physician  HISTORY OF PRESENT ILLNESS: The patient is a 59 year old, African-American male who currently lives at [facility] [place] and has a legal guardian over there, presented to the Emergency Department, as he reported that he has stopped taking his medications for the past 2 weeks. The patient was also in the ED last week, as he was trying to run away to [place] with a delusion that he will find his family there. He was brought back to the ED today by the [facility] Department again, as he was refusing his medications since yesterday. He was found standing outside his group home with his arms in the air talking to God and showing other unusual behaviors. He has returned to his group home last week, and was trying to leave his group home again to pursue a delusion that he would reunite with his family in [place]. The group home director, Chales Abrahams, called the [place] police to transfer the patient to the Emergency Department, as he has been refusing his medications, and  where he was beating his chest, yelling and refusing to return to the group home. The patient was noted to be standing in the yard talking to the guards, screaming, refusing medications and refusing to remain at the group home. He was hugging people inappropriately and appeared to be responding to internal stimuli.   When I evaluated the patient, he was lying in the bed under the covers, and reported that he has stopped his medication for the past 2 weeks, which cannot be possible as he was seen one week ago, and at that time he was taking his medication. The patient is currently on Clozaril and has been compliant with it. However, during my interview, patient reported that the Clozaril makes him very dry and scaly, and he  feels like dirt. He reported that he does not want to take the medication any more. He reported that since he has stopped the medication, he is feeling much better. He reported that now he feels very calm and polite, and he does not feel any dirt inside him. He stated that he is not hearing any voices and not having any auditory or visual hallucinations. Upon further questioning, he reported that sometimes he feels that somebody is after him and they are chasing him. He reported that he also hears voices occasionally. He was unable to elaborate on the voices. He denied having any thoughts to harm himself.   PAST PSYCHIATRIC HISTORY: The patient has long history of schizophrenia, and has a history of multiple hospitalizations. He currently lives in a group home. He follows with his psychiatrist and has been started on Clozaril, and currently takes 500 mg at bedtime. He was also recently admitted to the hospital because of lithium toxicity when he was falling.   MEDICAL HISTORY:  Patient has history of  1.  Lithium toxicity.  2.  Hypothyroidism.  3.  Benign prostatic hypertrophy.  4.  Vitamin D deficiency.   ALLERGIES: No known drug allergies.   SOCIAL HISTORY:  Not known. Patient lives in a group home.   MENTAL STATUS EXAMINATION:  A moderately-built male who appeared his stated age. He was calm and cooperative, and was noted to be responding to internal stimuli. He was lying in the bed.  His speech was somewhat difficult to understand. His mood was depressed. Affect was blunted. Thought process tangential. He was noted to be responding to internal stimuli. Has auditory and visual hallucinations. He does not have any suicidal ideations at this time. He denied having any homicidal ideations. Demonstrated poor insight and judgment about his illness.   VITAL SIGNS:  Temperature 98, pulse 79, respirations 18, blood pressure 130/74.   LABS:  Glucose 91, BUN 8, creatinine 0.9, sodium 141, potassium 4.0,  chloride 109, bicarbonate 25, anion gap 7, osmolality 279. Blood alcohol level less than 3. Protein 7.5, albumin 3.3, bilirubin 0.3, alkaline phosphatase 138, AST 29, ALT 27. Urine drug screen was negative. TSH was 30.40.   DIAGNOSTIC IMPRESSIONS: AXIS I:  Schizophrenia, chronic, paranoid type.   AXIS II:  None.   AXIS III:  Please review the medical history.   TREATMENT PLAN: 1.  The patient will be monitored closely in the Emergency Department, and will be restarted back on his medications. I will decrease the dose of Clozaril to 400 mg at bedtime.   2.  He will also be given Zyprexa 5 mg q. 4-6 hours p.r.n. for agitation.   3.  He will also be given Benadryl 25 mg q. 6 hours p.r.n. for agitation.   He will be monitored closely, and will obtain collateral information from his group home. Once patient becomes clinically stable, he will be discharged back to his group home for stabilization.   Thank you for allowing me to participate in the care of this patient.    ____________________________ Ardeen FillersUzma S. Garnetta BuddyFaheem, MD usf:mr D: 10/03/2012 18:52:00 ET T: 10/03/2012 19:32:22 ET JOB#: [id number]  cc: Ardeen FillersUzma S. Garnetta BuddyFaheem, MD, <Dictator> Rhunette CroftUZMA S Meilin Brosh MD ELECTRONICALLY SIGNED 10/04/2012 12:16

## 2014-09-30 NOTE — Consult Note (Signed)
Ferguson NAME:  Randy Ferguson, Randy Ferguson MR#:  [id number] DATE OF BIRTH:  08-May-1956  DATE OF CONSULTATION:  02/03/2013  REFERRING PHYSICIAN:   CONSULTING PHYSICIAN:  Audery Amel, MD  IDENTIFYING INFORMATION AND REASON FOR CONSULT: A 59 year old man with a history of schizophrenia, brought to [facility] under involuntary commitment papers after disruptive behavior at outpatient psychiatrist. Randy Ferguson did not offer any chief complaint.    HISTORY OF PRESENT ILLNESS: Information obtained from Randy Ferguson and Randy chart and secondhand from his group home supervisor. Randy Ferguson apparently had gone to his scheduled appointment with his outpatient psychiatrist today. Somehow Randy Ferguson became agitated and was refusing to get into Randy car. He may have been refusing to get a long-acting injection. Psychiatrist found him to be uncooperative, oppositional and disruptive and petitioned him into Randy hospital. Randy group home staff reports that he is pretty much at his normal state of behavior. He has not been threatening to harm himself or threatening anyone else. Randy Ferguson is chronically psychotic and disorganized. Randy Ferguson himself is unable to offer  much in Randy way of coherent information. He tells me that he has no thought of hurting himself or anyone else. His account of what happened at Randy doctor's  appointment is somewhat rambling and hard to follow.   PAST PSYCHIATRIC HISTORY: Long history of schizophrenia. Multiple medications in Randy past. Randy Ferguson has at times been particularly disruptive when he becomes fixed on Randy idea that he wants to go visit his family. His family is not involved in his life and, according to Randy group home worker, actually have recently very explicitly rejected him. This seems to just make a more agitated at times. No known history of suicide attempts. No serious aggression. He has run away from group homes and failed to take care of himself in Randy past. Currently being  maintained on antipsychotic medication. Randy group home seems to have a pretty good rapport with him.    PAST MEDICAL HISTORY: Prostate hypertrophy, gastric reflux, hypothyroidism vitamin D deficiency, severe tardive dyskinesia.   SOCIAL HISTORY: Randy Ferguson lives at a group home, where they seem to have pretty good rapport with him. His family is not involved in his life. Chronic schizophrenic.   SUBSTANCE ABUSE HISTORY: No recent substance abuse problems at least.   CURRENT MEDICATIONS: Seroquel 800 mg at night, Klonopin 1 mg twice a day, omeprazole 20 mg once a day, levothyroxine 200 mcg once a day vitamin D 5000 units once a day, Flomax 0.4 mg once a day, hydrochlorothiazide and lisinopril combination 12.5/10 mg once a day, finasteride 5 mg once a day, Cogentin 2 mg at night.   ALLERGIES: No known drug allergies.   MENTAL STATUS EXAMINATION: Disheveled gentleman. Not agitated or disruptive. Good eye contact. Fidgety psychomotor activity. Speech is slurred hard to understand in part due to his tardive dyskinesia in part due to his disorganized thinking. Denies any acute hallucinations. Denies suicidal or homicidal ideation. Not showing any agitated behavior. Affect euthymic. Thoughts grossly disorganized and rambling. Possibly responding to internal stimuli. Chronically impaired cognition from mental illness.   ASSESSMENT: A 59 year old gentleman with schizophrenia who appears to be at his baseline especially judged against how he was when he was in Randy hospital last time. He seems to have had some disruptive behavior today while seeing his outpatient psychiatrist, but is now recovered from it and is not acutely dangerous. Does not meet criteria for inpatient hospitalization.   TREATMENT PLAN: No  change to medication. Group home is agreeable to taking Randy Ferguson back. He will be discharged from Randy Emergency Room. Continue on current medicine and follow up with [facility].   DIAGNOSIS, PRINCIPAL AND  PRIMARY:   AXIS I: Schizophrenia, undifferentiated.   SECONDARY DIAGNOSES: AXIS I: No further.   AXIS II: Deferred.   AXIS III: Hypothyroidism, hypertension, tardive dyskinesia, gastric reflux symptoms.   AXIS IV: Severe from his disappointment at his social isolation.   AXIS V: Functioning at time of evaluation and discharge 45.    ____________________________ Audery AmelJohn T. Takao Lizer, MD jtc:cc D: 02/03/2013 23:58:47 ET T: 02/04/2013 03:27:42 ET JOB#: [id number]  cc: Audery AmelJohn T. Cletis Clack, MD, <Dictator> Audery AmelJOHN T Hibo Blasdell MD ELECTRONICALLY SIGNED 02/04/2013 10:20

## 2014-10-18 NOTE — H&P (Signed)
PATIENT NAME:  [name], Hudson [id number] OF BIRTH:  July 09, 1955 OF ADMISSION: 02/24/2013 PHYSICIAN:  Minna AntisKevin Paduchowski, MDPHYSICIAN:  Ellin GoodieJolanta B. Kristelle Cavallaro, MD DATA:  [name] is a 59 year old male with history of schizophrenia.  COMPLAINT:  "People in the house are trying to kill me."  OF PRESENT ILLNESS:  [name] has a long history of mental illness.  He was recently hospitalized at [facility]. His Clozaril was discontinued due to thrombocytopenia. He was started on Seroquel. He came to the ER two weeks ago and spent a week in ER while we were trying to adjust his medications. He was discharged to his group home. He did not do well. Dr. Barnett AbuWiseman adjusted his medications once again but the patient did not do well. He returns the hospital floridly psychotic complaining that "they are not giving me the right medications". He left his group home several times and refused to come back at the end. He is welcomed back there. The patient denies any symptoms of depression or psychosis.  He minimizes his problems at the group home.  Reportedly there is no history of substance use.  PSYCHIATRIC HISTORY:  There were prior hospitalizations, at least twice at [facility].  There is history of lithium toxicity.  PSYCHIATRIC HISTORY:  Unknown.  MEDICAL HISTORY:  Constipation, benign prostatic hypertrophy, hypertension, hypothyroidism, GERD, vitamin D deficiency, thrombocytopenia, most likely due to Clozaril.  ON ADMISSION:  Abilify 20 mg daily, Amitiza 8 mcg twice daily, Cogentin 2 mg at bedtime, Klonopin 1 mg three times daily, Colace 100 mg twice daily, finasteride 5 mg daily, hydrochlorothiazide lisinopril 12.5/10 mg daily, hydroxyzine 25 mg daily, Synthroid 200 mcg daily, omeprazole 20 mg daily, Seroquel 800 mg at bedtime, Tamsulosin 0.4 mg daily, Toviaz 8 mg daily, vitamin D 5000 units daily, Clozaril 100 mg twice daily.Marland Kitchen.   HISTORY:  The patient lives in a group home.  There is no family contact.   He longs for his daughter and granddaughter who live in [place].  He has been at the group home for quite a bit and he is welcome back.  OF SYSTEMS: Difficult to obtain.  The patient does not complain of any physical pain or discomfort.  EXAMINATION:SIGNS:  Blood pressure 130/88, pulse 95, respirations 18, temperature 98.3.  This is an elderly gentlemen in no acute distress, looking older than the stated age. The pupils are equal, rounds and reactive to light. Sclerae anicteric. Supple. No thyromegaly. Clear to auscultation. No dullness to percussion. Regular rhythm and rate. No murmurs, rubs, or gallops. Soft, nontender, nondistended. Positive bowel sounds. Normal muscle strength in all extremities. No rashes or bruises. No cervical adenopathy. Cranial nerves II-XII are intact.  DATA:  Chemistries are within normal limits.  Blood alcohol level 0.  LFTs within normal limits except for alkaline phosphatase of 148.  TSH 2.11.  Urine tox screen negative for substances. CBC within normal limits.   STATUS EXAMINATION:  The patient is alert and oriented to person, place and somewhat situation.  He says it is August.  He is pleasant, polite and cooperative.  He is wearing hospital scrubs.  He is adequately groomed.  He maintains good eye contact.  His speech is loud and disorganized. He calls me his mother. Mood is fine with labile, exuberant affect.  Thought process is disorganized. He denies thoughts of hurting himself or others.  He is delusional and paranoid. The patient does not appear to attend to internal stimuli.  His cognition is difficult to assess.  His  insight and judgment are questionable.  I:   Schizoaffective disorder bipolar type.  II:   Deferred. III:  Constipation, benign prostatic hypertrophy, hypertension, hypothyroidism, gastroesophageal reflux disease, vitamin D deficiency. IV:   Mental illness, primary support, conflict at the group home, too frequent medication changes. V:   Global assessment  of functioning at the time of discharge 45.  The patient was admitted to [facility] for safety, stabilization and medication management. He was initially placed on suicide precautions and was closely monitored for any unsafe behaviors. He underwent full psychiatric and risk assessment. He received pharmacotherapy, individual and group psychotherapy, substance abuse counseling, and support from therapeutic milieu.   Psychosis: We will continue to crosstaper Seroquel and Clozaril. Check platelets.  Medical: we will continue all medications as in the community. Dispo: Back to his group home.    Electronic Signatures: Kristine LineaPucilowska, Coury Grieger (MD)  (Signed on 18-Sep-14 13:08)  Authored  Last Updated: 18-Sep-14 13:08 by Kristine LineaPucilowska, Lucynda Rosano (MD)

## 2017-11-23 ENCOUNTER — Encounter: Payer: Self-pay | Admitting: Emergency Medicine

## 2017-11-23 ENCOUNTER — Emergency Department
Admission: EM | Admit: 2017-11-23 | Discharge: 2017-11-23 | Disposition: A | Discharge status: Home / Self Care | Payer: Medicare Other | Attending: Emergency Medicine | Admitting: Emergency Medicine

## 2017-11-23 ENCOUNTER — Emergency Department: Payer: Medicare Other

## 2017-11-23 ENCOUNTER — Other Ambulatory Visit: Payer: Self-pay

## 2017-11-23 DIAGNOSIS — F17210 Nicotine dependence, cigarettes, uncomplicated: Secondary | ICD-10-CM | POA: Insufficient documentation

## 2017-11-23 DIAGNOSIS — R4182 Altered mental status, unspecified: Secondary | ICD-10-CM | POA: Insufficient documentation

## 2017-11-23 DIAGNOSIS — I10 Essential (primary) hypertension: Secondary | ICD-10-CM | POA: Diagnosis not present

## 2017-11-23 DIAGNOSIS — E039 Hypothyroidism, unspecified: Secondary | ICD-10-CM | POA: Insufficient documentation

## 2017-11-23 HISTORY — DX: Schizophrenia, unspecified: F20.9

## 2017-11-23 HISTORY — DX: Essential (primary) hypertension: I10

## 2017-11-23 HISTORY — DX: Thrombocytopenia, unspecified: D69.6

## 2017-11-23 HISTORY — DX: Gastro-esophageal reflux disease without esophagitis: K21.9

## 2017-11-23 HISTORY — DX: Disorder of thyroid, unspecified: E07.9

## 2017-11-23 LAB — URINALYSIS, COMPLETE (UACMP) WITH MICROSCOPIC
Bacteria, UA: NONE SEEN
Bilirubin Urine: NEGATIVE
GLUCOSE, UA: NEGATIVE mg/dL
HGB URINE DIPSTICK: NEGATIVE
KETONES UR: NEGATIVE mg/dL
LEUKOCYTES UA: NEGATIVE
Nitrite: NEGATIVE
PH: 6 (ref 5.0–8.0)
Protein, ur: NEGATIVE mg/dL
Specific Gravity, Urine: 1.023 (ref 1.005–1.030)
Squamous Epithelial / LPF: NONE SEEN (ref 0–5)

## 2017-11-23 LAB — BASIC METABOLIC PANEL
Anion gap: 8 (ref 5–15)
BUN: 16 mg/dL (ref 6–20)
CALCIUM: 9.4 mg/dL (ref 8.9–10.3)
CHLORIDE: 104 mmol/L (ref 101–111)
CO2: 27 mmol/L (ref 22–32)
Creatinine, Ser: 0.98 mg/dL (ref 0.61–1.24)
GFR calc Af Amer: >60 mL/min (ref >60)
GFR calc non Af Amer: >60 mL/min (ref >60)
Glucose, Bld: 102 mg/dL — ABNORMAL HIGH (ref 65–99)
Potassium: 3.8 mmol/L (ref 3.5–5.1)
Sodium: 139 mmol/L (ref 135–145)

## 2017-11-23 LAB — URINE DRUG SCREEN, QUALITATIVE (ARMC ONLY)
AMPHETAMINES, UR SCREEN: NOT DETECTED
BENZODIAZEPINE, UR SCRN: POSITIVE — AB
Cannabinoid 50 Ng, Ur ~~LOC~~: NOT DETECTED
Cocaine Metabolite,Ur ~~LOC~~: NOT DETECTED
MDMA (Ecstasy)Ur Screen: NOT DETECTED
Methadone Scn, Ur: NOT DETECTED
Opiate, Ur Screen: NOT DETECTED
PHENCYCLIDINE (PCP) UR S: NOT DETECTED
TRICYCLIC, UR SCREEN: NOT DETECTED

## 2017-11-23 LAB — CBC
HEMATOCRIT: 37.2 % — AB (ref 40.0–52.0)
HEMOGLOBIN: 12.5 g/dL — AB (ref 13.0–18.0)
MCH: 33.1 pg (ref 26.0–34.0)
MCHC: 33.6 g/dL (ref 32.0–36.0)
MCV: 98.5 fL (ref 80.0–100.0)
Platelets: 95 10*3/uL — ABNORMAL LOW (ref 150–440)
RBC: 3.77 MIL/uL — ABNORMAL LOW (ref 4.40–5.90)
RDW: 16.2 % — AB (ref 11.5–14.5)
WBC: 4.2 10*3/uL (ref 3.8–10.6)

## 2017-11-23 LAB — ETHANOL

## 2017-11-23 NOTE — Discharge Instructions (Addendum)
Please return to the emergency department if you develop severe pain, changes in mental status, fever, or any other symptoms concerning to you.

## 2017-11-23 NOTE — ED Notes (Signed)
Called another number that was found online [phone] for possibly [name] from the group home left voicemail to call back.

## 2017-11-23 NOTE — ED Provider Notes (Signed)
[facility] Emergency Department Provider Note  ____________________________________________  Time seen: Approximately 10:00 AM  I have reviewed the triage vital signs and the nursing notes.   HISTORY  Chief Complaint Hematuria and Altered Mental Status    HPI Hesham Womac is a 62 y.o. male history of schizophrenia, thrombus cytopenia, HTN, living in a group home, found wandering down the street with blood running down his left leg.  The patient is unable to give me a clear history and is a poor historian.  He said "I had to get away" and was apparently running away from his group home.  EMS reports that he was "staggering."  Patient denies any pain at this time.  He does not know where the trickle of blood which is on his leg came from and has no apparent injury.  Patient has no medical complaints at this time..   Past Medical History:  Diagnosis Date  . GERD (gastroesophageal reflux disease)   . Hypertension   . Schizophrenia (HCC)   . Thrombocytopenia (HCC)   . Thyroid disease     There are no active problems to display for this patient.   History reviewed. No pertinent surgical history.    Allergies Patient has no known allergies.  History reviewed. No pertinent family history.  Social History Social History   Tobacco Use  . Smoking status: Current Every Day Smoker    Packs/day: 0.50    Types: Cigarettes  . Smokeless tobacco: Never Used  Substance Use Topics  . Alcohol use: Not Currently  . Drug use: Not on file    Review of Systems Difficult to obtain due to patient being a poor historian.  The patient denies any pain at this time.  ____________________________________________   PHYSICAL EXAM:  VITAL SIGNS: ED Triage Vitals  Enc Vitals Group     BP 11/23/17 0925 124/85     Pulse Rate 11/23/17 0925 91     Resp 11/23/17 0925 18     Temp 11/23/17 0925 98.9 F (37.2 C)     Temp Source 11/23/17 0925 Oral     SpO2 11/23/17  0925 95 %     Weight 11/23/17 0926 163 lb 12.8 oz (74.3 kg)     Height 11/23/17 0926 5\' 5"  (1.651 m)     Head Circumference --      Peak Flow --      Pain Score 11/23/17 0926 0     Pain Loc --      Pain Edu? --      Excl. in GC? --      Constitutional: The patient is alert and able to comply with most of my examination but unable to answer some basic questions. Eyes: Conjunctivae are normal.  EOMI. PERRLA.  No scleral icterus.  No raccoon eyes. Head: Atraumatic.  No battle sign. Nose: No congestion/rhinnorhea.  No swelling over the nose or septal hematoma. Mouth/Throat: Mucous membranes are moist.  Adentulous. Neck: No stridor.  Supple.  Full range of motion without pain. Cardiovascular: Normal rate, regular rhythm. No murmurs, rubs or gallops.  Respiratory: Normal respiratory effort.  No accessory muscle use or retractions. Lungs CTAB.  No wheezes, rales or ronchi. Gastrointestinal: Soft, nontender and nondistended.  No guarding or rebound.  No peritoneal signs. Genitourinary: Normal-appearing penis without lesions; no evidence of bleeding at the urinary meatus.  Normal testicles without tenderness to palpation or palpable mass.. Musculoskeletal: No LE edema. Neurologic:  A&Ox3.  Speech is clear.  Face  and smile are symmetric.  EOMI.  Moves all extremities well. Skin:  Skin is warm, dry and intact. No rash noted.  The patient does have a single trickle of dried blood which runs from the mid thigh to the ankle on the medial aspect of the left leg.  There is no evidence for the source of this blood; I do not see any lacerations, scratches, or blood on the penis. Psychiatric: The patient has flight of ideas, and is unable to answer most questions. ____________________________________________   LABS (all labs ordered are listed, but only abnormal results are displayed)  Labs Reviewed  URINALYSIS, COMPLETE (UACMP) WITH MICROSCOPIC - Abnormal; Notable for the following components:       Result Value   Color, Urine YELLOW (*)    APPearance CLEAR (*)    All other components within normal limits  CBC - Abnormal; Notable for the following components:   RBC 3.77 (*)    Hemoglobin 12.5 (*)    HCT 37.2 (*)    RDW 16.2 (*)    Platelets 95 (*)    All other components within normal limits  BASIC METABOLIC PANEL - Abnormal; Notable for the following components:   Glucose, Bld 102 (*)    All other components within normal limits  URINE DRUG SCREEN, QUALITATIVE ([facility] ONLY) - Abnormal; Notable for the following components:   Barbiturates, Ur Screen   (*)    Value: Result not available. Reagent lot number recalled by manufacturer.   Benzodiazepine, Ur Scrn POSITIVE (*)    All other components within normal limits  ETHANOL   ____________________________________________  EKG  Not indicated ____________________________________________  RADIOLOGY  Ct Head Wo Contrast  Result Date: 11/23/2017 CLINICAL DATA:  found by [facility] with reports of a pool of blood around him. Pt had blood trickle down his leg on arrival and had foul odor of urine. EXAM: CT HEAD WITHOUT CONTRAST TECHNIQUE: Contiguous axial images were obtained from the base of the skull through the vertex without intravenous contrast. COMPARISON:  02/27/2012 FINDINGS: Brain: Mild parenchymal atrophy. Patchy areas of mild hypoattenuation in deep and periventricular white matter bilaterally. Negative for acute intracranial hemorrhage, mass lesion, acute infarction, midline shift, or mass-effect. Acute infarct may be inapparent on noncontrast CT. Stable early asymmetric basal ganglia mineralization. Ventricles and sulci symmetric. Vascular: Atherosclerotic and physiologic intracranial calcifications. Skull: Normal. Negative for fracture or focal lesion. Sinuses/Orbits: Retention cyst or polyp in the left frontal sinus. Old fracture deformity of the medial left orbital wall. No acute fracture. Other: None IMPRESSION: No acute findings.  Electronically Signed   By: Corlis Leak M.D.   On: 11/23/2017 10:27    ____________________________________________   PROCEDURES  Procedure(s) performed: None  Procedures  Critical Care performed: No ____________________________________________   INITIAL IMPRESSION / ASSESSMENT AND PLAN / ED COURSE  Pertinent labs & imaging results that were available during my care of the patient were reviewed by me and considered in my medical decision making (see chart for details).  62 y.o. male with a history of schizophrenia from a group home, presenting for staggering on the street, possible altered mental status, and some crusted blood which the source is unclear.  Overall, the patient is hemodynamically stable.  It will be imperative to contact his group home to have someone evaluate him to see if he is at his baseline.  In the meantime, we will check basic blood work, including urinalysis, and get a CT of the head.  At this time, the  patient is in stable condition.  Plan reevaluation for final disposition.  ----------------------------------------- 11:41 AM on 11/23/2017 -----------------------------------------  Patient's work-up in the emergency department has been reassuring.  His electrolytes are within normal limits, and his blood counts do show mild anemia and thrombocytopenia, but he has had a history of this in the past.  I have reviewed the patient's medical chart and do not have any recent records; the last records we have is from 2014.  The patient certainly does not exhibit any signs of hemodynamic instability or acute bleeding.  I did not find a source for the crusted blood on his leg.  The patient CT head shows no acute intracranial process.  Urinalysis does not show infection.  At this time, this patient is stable for discharge home.  His group home has been contacted, and they will come to pick him up.  We will confirm with them once they arrive that the patient is at his  baseline.  ____________________________________________  FINAL CLINICAL IMPRESSION(S) / ED DIAGNOSES  Final diagnoses:  Altered mental status, unspecified altered mental status type         NEW MEDICATIONS STARTED DURING THIS VISIT:  New Prescriptions   No medications on file      Rockne MenghiniNorman, Anne-Caroline, MD 11/23/17 1143

## 2017-11-23 NOTE — ED Notes (Signed)
Spoke with Randy HuskBilly Ferguson to inform her that the patient is discharged and waiting to be picked up and that the director has not yet shown up.

## 2017-11-23 NOTE — ED Notes (Signed)
Pt sitting up in the bed eating lunch and drinking gingerale.

## 2017-11-23 NOTE — ED Notes (Addendum)
Pt ate 100% of lunch and drank gingerale. Pt able to ambulate in the hallway, pt slightly unsteady, but only required stand-by assistance.  Awaiting group home director to come pick up the patient. Pt dressed in blue paper scrubs.

## 2017-11-23 NOTE — ED Notes (Signed)
Again called Randy HuskBilly Ferguson [phone] left voicemail to call back ASAP and that the patient needs to be picked up as soon as possible.

## 2017-11-23 NOTE — ED Notes (Signed)
Called the Group Home [facility] [phone]- [phone] per the person that answered the phone, there was no staff at the group home.

## 2017-11-23 NOTE — ED Notes (Addendum)
Spoke with Britta MccreedyBilly Jo Jones, [phone] who is familiar with the patient states pt has been at his baseline. She reports that the patient reported he had heard voices to run so he did. She also reports that the patient c/o his stomach hurting as well.  Estella HuskBilly Jo states that the director of the Group Home would be coming to the ED around 130pm after church

## 2017-11-23 NOTE — ED Notes (Signed)
Patient transported to CT 

## 2017-11-23 NOTE — ED Triage Notes (Addendum)
Pt arrived via EMS after running away from the group home this morning.  Pt was found running from the group home about 1/2 mile away.   Pt was found by [facility] with reports of a pool of blood around him. Pt had blood trickle down his leg on arrival and had foul odor of urine.    Pt is alert and oriented to self and place at this time and answer questions appropriately.

## 2017-11-23 NOTE — ED Notes (Signed)
Randy HuskBilly Ferguson called from group home and asked if he is being IVC'd. Pt is safe, no SI/ HI. Randy HuskBilly Ferguson is aware he is discharged and states Randy Ferguson will be coming to pick him up

## 2017-11-23 NOTE — ED Notes (Signed)
Lab notified to add-on urine drug screen.  

## 2018-06-30 IMAGING — CT CT HEAD W/O CM
3 series · 15 of 45 positions shown, 18 images · non-contrast
Comparison: 02/27/2012

CLINICAL DATA: found by [REDACTED] with reports of a pool of blood around
him. Pt had blood trickle down his leg on arrival and had foul odor
of urine.

EXAM:
CT HEAD WITHOUT CONTRAST
TECHNIQUE: Contiguous axial images were obtained from the base of the skull
through the vertex without intravenous contrast.

[Series 2: head wo · axial · 0.47mm/px · z∈[-147,-32]mm · 9 of 28 slices shown, 12 images]
[im 3/28  brain]
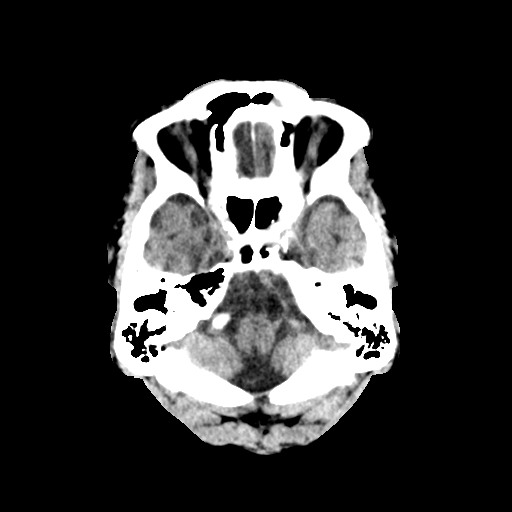
[im 3/28  bone]
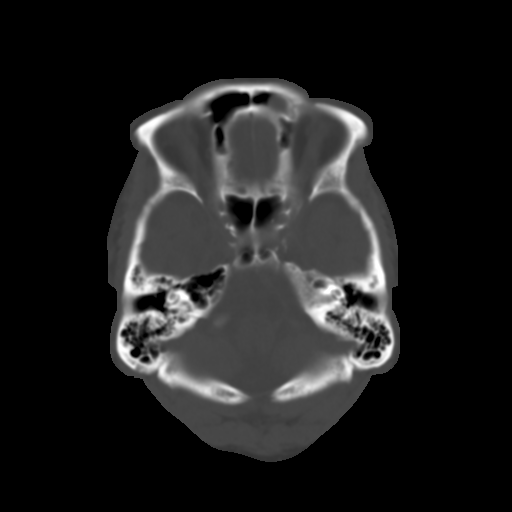
[im 6/28  brain]
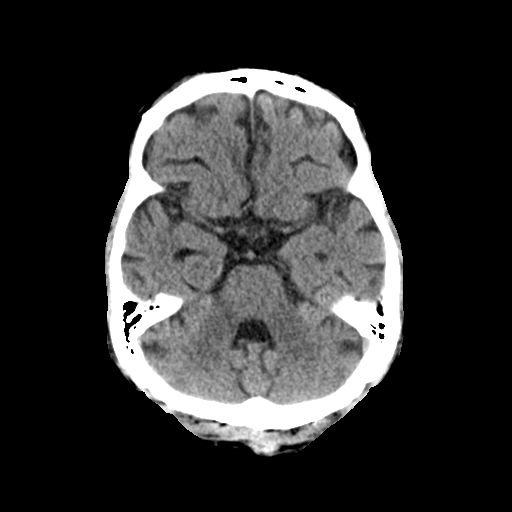
[im 9/28  brain]
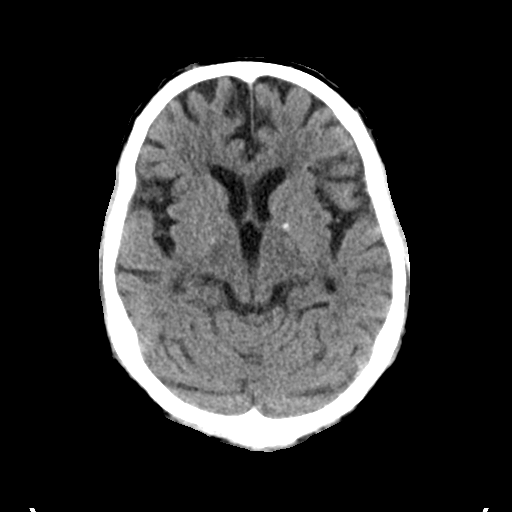
[im 12/28  brain]
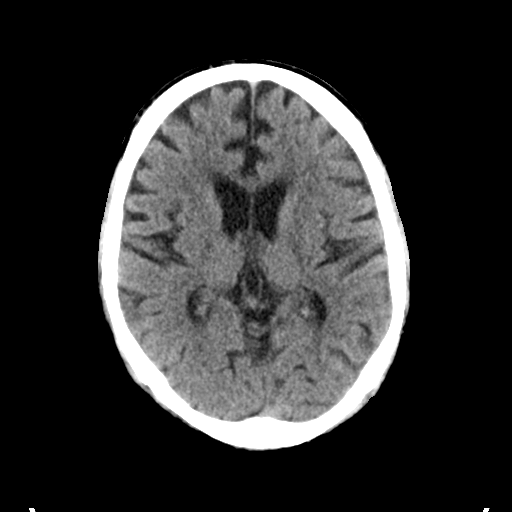
[im 15/28  brain]
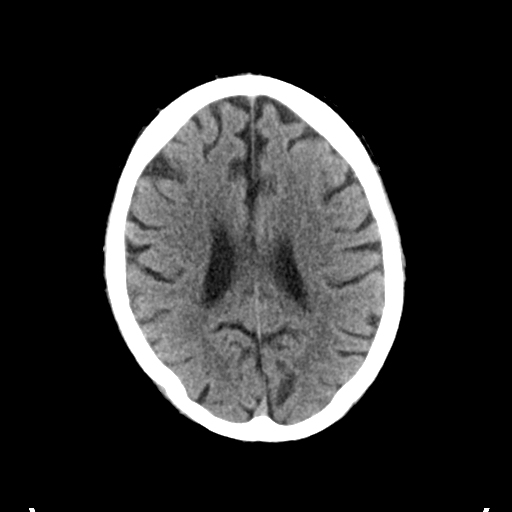
[im 15/28  bone]
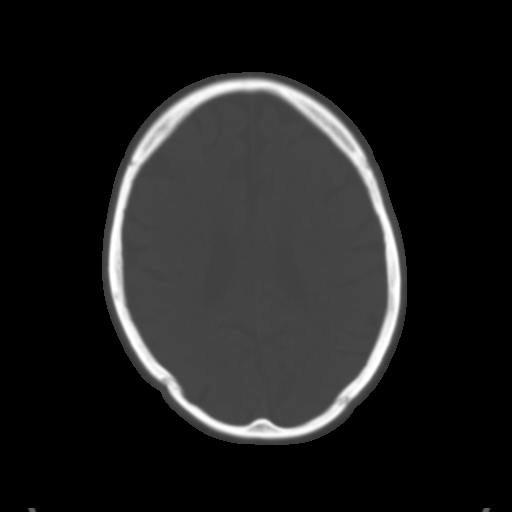
[im 17/28  brain]
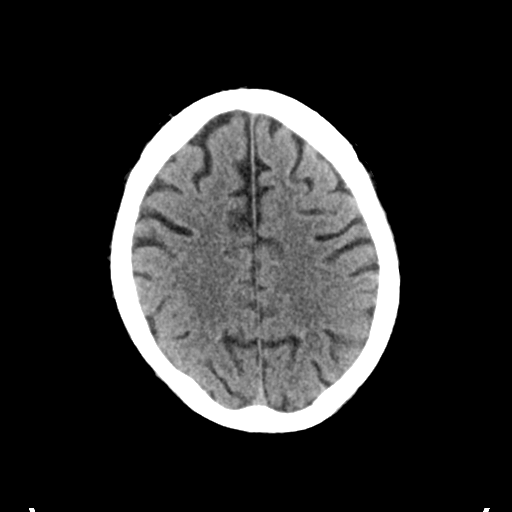
[im 20/28  brain]
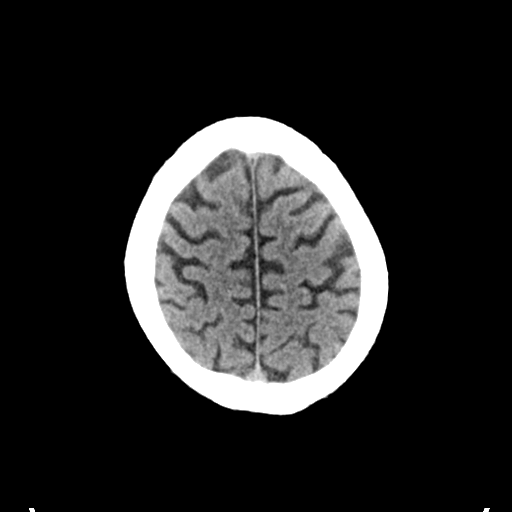
[im 23/28  brain]
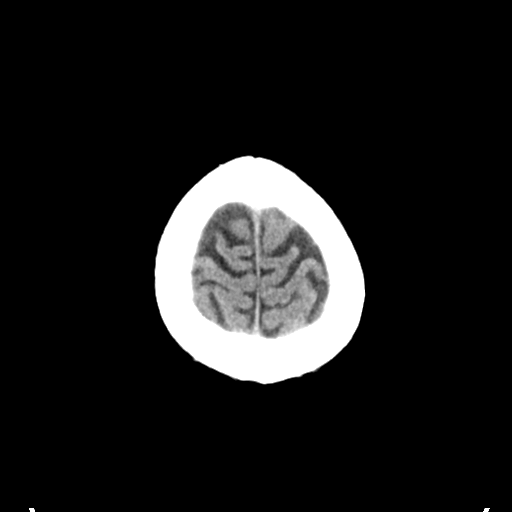
[im 26/28  brain]
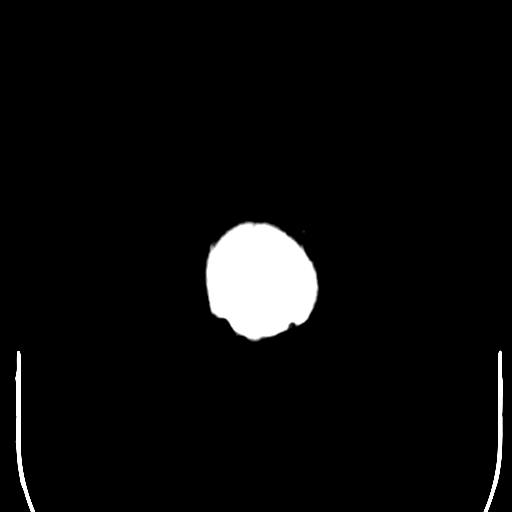
[im 26/28  bone]
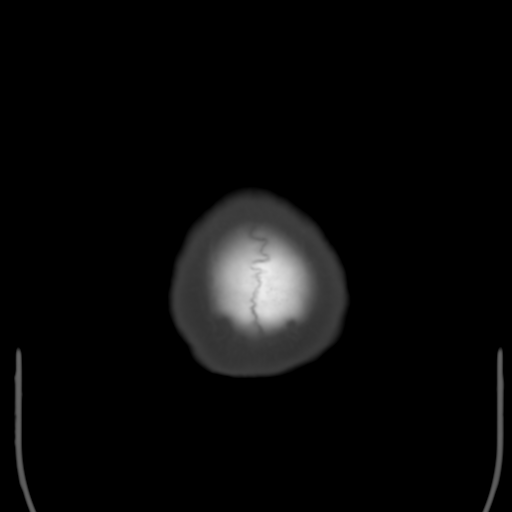

[Series 4: coronal soft tissue · coronal · 0.27mm/px · 3 of 66 slices shown]
[im 22/66  brain]
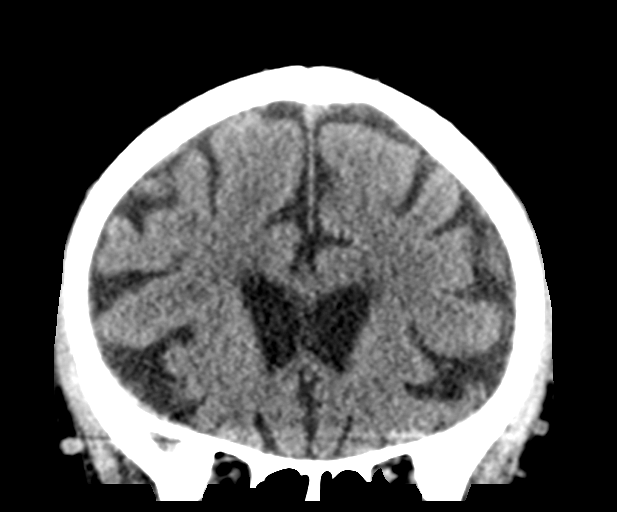
[im 29/66  brain]
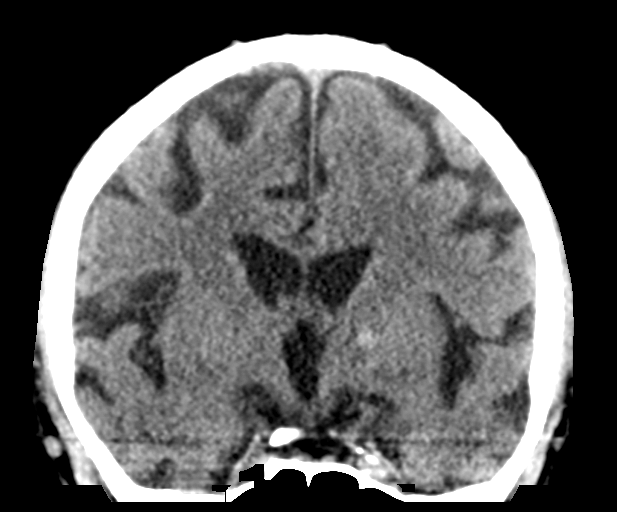
[im 37/66  brain]
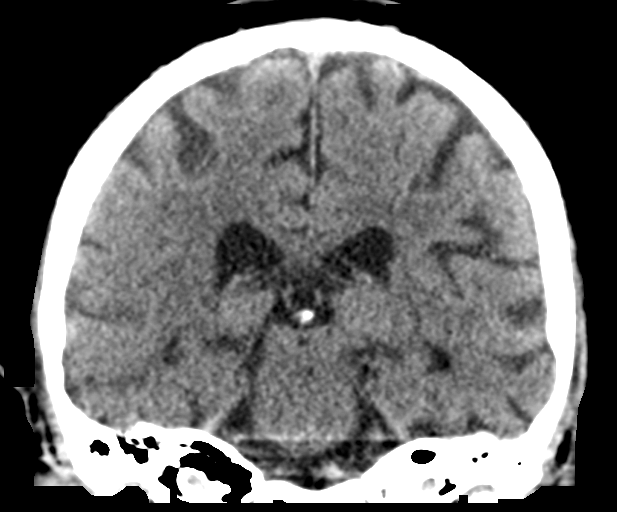

[Series 5: sagittal soft tissue · sagittal · 0.27mm/px · 3 of 52 slices shown]
[im 18/52  brain]
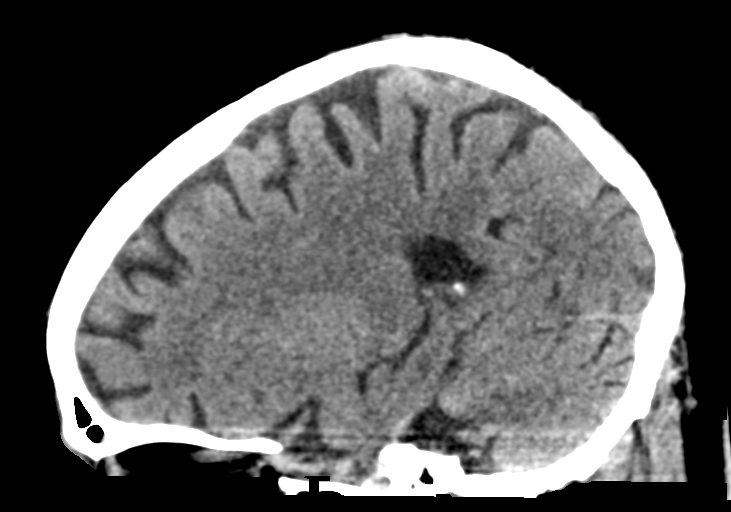
[im 26/52  brain]
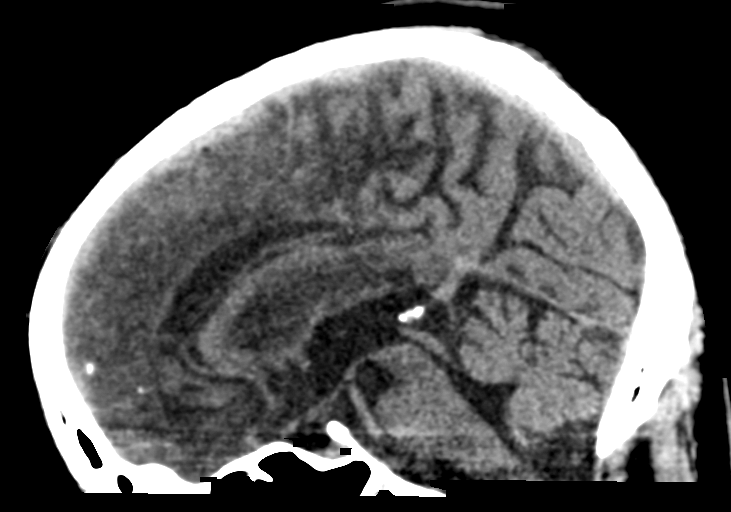
[im 35/52  brain]
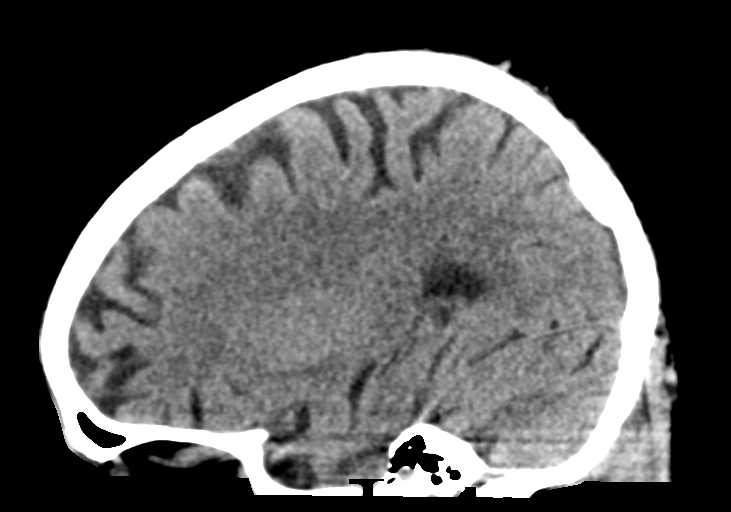

[15 of 45 positions shown; findings below may reference images not displayed]

FINDINGS: Brain: Mild parenchymal atrophy. Patchy areas of mild
hypoattenuation in deep and periventricular white matter
bilaterally. Negative for acute intracranial hemorrhage, mass
lesion, acute infarction, midline shift, or mass-effect. Acute
infarct may be inapparent on noncontrast CT. Stable early asymmetric
basal ganglia mineralization. Ventricles and sulci symmetric.

Vascular: Atherosclerotic and physiologic intracranial
calcifications.

Skull: Normal. Negative for fracture or focal lesion.

Sinuses/Orbits: Retention cyst or polyp in the left frontal sinus.
Old fracture deformity of the medial left orbital wall. No acute
fracture.

Other: None
IMPRESSION: No acute findings.

## 2018-10-20 ENCOUNTER — Encounter: Payer: Self-pay | Admitting: *Deleted

## 2018-10-20 ENCOUNTER — Other Ambulatory Visit: Payer: Self-pay

## 2018-10-20 ENCOUNTER — Emergency Department
Admission: EM | Admit: 2018-10-20 | Discharge: 2018-10-21 | Disposition: A | Discharge status: Other Acute Inpatient Hospital | Payer: Medicare Other | Attending: Emergency Medicine | Admitting: Emergency Medicine

## 2018-10-20 DIAGNOSIS — F17210 Nicotine dependence, cigarettes, uncomplicated: Secondary | ICD-10-CM | POA: Insufficient documentation

## 2018-10-20 DIAGNOSIS — F209 Schizophrenia, unspecified: Secondary | ICD-10-CM

## 2018-10-20 DIAGNOSIS — I10 Essential (primary) hypertension: Secondary | ICD-10-CM | POA: Insufficient documentation

## 2018-10-20 DIAGNOSIS — Z1159 Encounter for screening for other viral diseases: Secondary | ICD-10-CM | POA: Insufficient documentation

## 2018-10-20 LAB — ETHANOL: Alcohol, Ethyl (B): <10 mg/dL (ref <10)

## 2018-10-20 NOTE — ED Notes (Addendum)
Pt has been dressed out by the EDT, pt was calm and cooperative. Pt belongings collected in pt belongings bag

## 2018-10-20 NOTE — ED Notes (Signed)
Pt. Transferred from Triage to room 22 after dressing out and screening for contraband. Pt. Oriented to Quad including Q15 minute rounds as well as Rover and Officer for their protection. Patient is alert and oriented, warm and dry in no acute distress. Patient denies SI, HI, and AVH. Pt. Encouraged to let me know if needs arise.  

## 2018-10-20 NOTE — ED Triage Notes (Signed)
Pt brought in by [facility].  Pt is a resident of a group home. Pt is IVC.  Pt denies SI or HI.  Pt denies etoh use or drug use.  Hx schizophrenia.  Pt calm and cooperative.
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
## 2018-10-21 ENCOUNTER — Encounter: Payer: Self-pay | Admitting: Behavioral Health

## 2018-10-21 ENCOUNTER — Inpatient Hospital Stay
Admission: AD | Admit: 2018-10-21 | Discharge: 2018-10-26 | DRG: 885 | Disposition: A | Discharge status: Home / Self Care | Payer: Medicare Other | Attending: Psychiatry | Admitting: Psychiatry

## 2018-10-21 DIAGNOSIS — F17210 Nicotine dependence, cigarettes, uncomplicated: Secondary | ICD-10-CM | POA: Diagnosis present

## 2018-10-21 DIAGNOSIS — D696 Thrombocytopenia, unspecified: Secondary | ICD-10-CM | POA: Diagnosis present

## 2018-10-21 DIAGNOSIS — N400 Enlarged prostate without lower urinary tract symptoms: Secondary | ICD-10-CM

## 2018-10-21 DIAGNOSIS — F209 Schizophrenia, unspecified: Secondary | ICD-10-CM | POA: Diagnosis present

## 2018-10-21 DIAGNOSIS — F259 Schizoaffective disorder, unspecified: Secondary | ICD-10-CM | POA: Diagnosis present

## 2018-10-21 DIAGNOSIS — E039 Hypothyroidism, unspecified: Secondary | ICD-10-CM | POA: Diagnosis present

## 2018-10-21 DIAGNOSIS — G2401 Drug induced subacute dyskinesia: Secondary | ICD-10-CM

## 2018-10-21 DIAGNOSIS — K219 Gastro-esophageal reflux disease without esophagitis: Secondary | ICD-10-CM | POA: Diagnosis present

## 2018-10-21 DIAGNOSIS — I10 Essential (primary) hypertension: Secondary | ICD-10-CM | POA: Diagnosis present

## 2018-10-21 LAB — CBC
HCT: 39.5 % (ref 39.0–52.0)
Hemoglobin: 12.8 g/dL — ABNORMAL LOW (ref 13.0–17.0)
MCH: 32.9 pg (ref 26.0–34.0)
MCHC: 32.4 g/dL (ref 30.0–36.0)
MCV: 101.5 fL — ABNORMAL HIGH (ref 80.0–100.0)
Platelets: 74 10*3/uL — ABNORMAL LOW (ref 150–400)
RBC: 3.89 MIL/uL — ABNORMAL LOW (ref 4.22–5.81)
RDW: 14.6 % (ref 11.5–15.5)
WBC: 5.9 10*3/uL (ref 4.0–10.5)
nRBC: 0 % (ref 0.0–0.2)

## 2018-10-21 LAB — COMPREHENSIVE METABOLIC PANEL
ALT: 38 U/L (ref 0–44)
AST: 44 U/L — ABNORMAL HIGH (ref 15–41)
Albumin: 3.9 g/dL (ref 3.5–5.0)
Alkaline Phosphatase: 73 U/L (ref 38–126)
Anion gap: 6 (ref 5–15)
BUN: 21 mg/dL — ABNORMAL HIGH (ref 6–20)
CO2: 31 mmol/L (ref 22–32)
Calcium: 9.3 mg/dL (ref 8.9–10.3)
Chloride: 102 mmol/L (ref 98–111)
Creatinine, Ser: 1 mg/dL (ref 0.61–1.24)
GFR calc Af Amer: >60 mL/min (ref >60)
GFR calc non Af Amer: >60 mL/min (ref >60)
Glucose, Bld: 84 mg/dL (ref 70–99)
Potassium: 4.2 mmol/L (ref 3.5–5.1)
Sodium: 139 mmol/L (ref 135–145)
Total Bilirubin: 0.4 mg/dL (ref 0.3–1.2)
Total Protein: 7.9 g/dL (ref 6.5–8.1)

## 2018-10-21 LAB — TSH: TSH: 32.451 u[IU]/mL — ABNORMAL HIGH (ref 0.350–4.500)

## 2018-10-21 LAB — VALPROIC ACID LEVEL: Valproic Acid Lvl: 52 ug/mL (ref 50.0–100.0)

## 2018-10-21 LAB — SARS CORONAVIRUS 2 BY RT PCR (HOSPITAL ORDER, PERFORMED IN ~~LOC~~ HOSPITAL LAB): SARS Coronavirus 2: NEGATIVE

## 2018-10-21 MED ORDER — VITAMIN B-12 1000 MCG PO TABS
1000.0000 ug | ORAL_TABLET | ORAL | Status: DC
  Administered 2018-10-22 – 2018-10-24 (×2): 1000 ug via ORAL
  Filled 2018-10-21 (×2): qty 1

## 2018-10-21 MED ORDER — DOCUSATE SODIUM 100 MG PO CAPS
100.0000 mg | ORAL_CAPSULE | Freq: Two times a day (BID) | ORAL | Status: DC
  Administered 2018-10-21 – 2018-10-26 (×10): 100 mg via ORAL
  Filled 2018-10-21 (×10): qty 1

## 2018-10-21 MED ORDER — TRAZODONE HCL 50 MG PO TABS
150.0000 mg | ORAL_TABLET | Freq: Every day | ORAL | Status: DC
  Administered 2018-10-21 – 2018-10-25 (×5): 150 mg via ORAL
  Filled 2018-10-21 (×5): qty 1

## 2018-10-21 MED ORDER — LISINOPRIL 20 MG PO TABS
10.0000 mg | ORAL_TABLET | Freq: Every day | ORAL | Status: DC
  Administered 2018-10-21 – 2018-10-26 (×5): 10 mg via ORAL
  Filled 2018-10-21 (×6): qty 1

## 2018-10-21 MED ORDER — MAGNESIUM HYDROXIDE 400 MG/5ML PO SUSP: 30.0000 mL | Freq: Every day | ORAL | Status: DC | PRN

## 2018-10-21 MED ORDER — ACETAMINOPHEN 325 MG PO TABS: 650.0000 mg | ORAL_TABLET | Freq: Four times a day (QID) | ORAL | Status: DC | PRN

## 2018-10-21 MED ORDER — LEVOTHYROXINE SODIUM 137 MCG PO TABS
137.0000 ug | ORAL_TABLET | Freq: Every day | ORAL | Status: DC
  Administered 2018-10-22: 137 ug via ORAL
  Filled 2018-10-21: qty 1

## 2018-10-21 MED ORDER — ALUM & MAG HYDROXIDE-SIMETH 200-200-20 MG/5ML PO SUSP: 30.0000 mL | ORAL | Status: DC | PRN

## 2018-10-21 MED ORDER — TAMSULOSIN HCL 0.4 MG PO CAPS
0.4000 mg | ORAL_CAPSULE | Freq: Every day | ORAL | Status: DC
  Administered 2018-10-21 – 2018-10-26 (×6): 0.4 mg via ORAL
  Filled 2018-10-21 (×6): qty 1

## 2018-10-21 MED ORDER — CLONAZEPAM 1 MG PO TABS: 1.0000 mg | ORAL_TABLET | Freq: Two times a day (BID) | ORAL | Status: DC

## 2018-10-21 MED ORDER — MELATONIN 5 MG PO TABS
1.0000 | ORAL_TABLET | Freq: Every day | ORAL | Status: DC
  Administered 2018-10-21 – 2018-10-25 (×5): 5 mg via ORAL
  Filled 2018-10-21 (×6): qty 1

## 2018-10-21 MED ORDER — CLONAZEPAM 1 MG PO TABS
1.0000 mg | ORAL_TABLET | Freq: Three times a day (TID) | ORAL | Status: DC
  Administered 2018-10-21 – 2018-10-26 (×13): 1 mg via ORAL
  Filled 2018-10-21 (×14): qty 1

## 2018-10-21 MED ORDER — FOLIC ACID 1 MG PO TABS
1.0000 mg | ORAL_TABLET | Freq: Every day | ORAL | Status: DC
  Administered 2018-10-21 – 2018-10-26 (×6): 1 mg via ORAL
  Filled 2018-10-21 (×5): qty 1

## 2018-10-21 MED ORDER — OXYBUTYNIN CHLORIDE 5 MG PO TABS
5.0000 mg | ORAL_TABLET | Freq: Two times a day (BID) | ORAL | Status: DC
  Administered 2018-10-21 – 2018-10-26 (×10): 5 mg via ORAL
  Filled 2018-10-21 (×11): qty 1

## 2018-10-21 MED ORDER — LISINOPRIL-HYDROCHLOROTHIAZIDE 10-12.5 MG PO TABS: 1.0000 | ORAL_TABLET | Freq: Every day | ORAL | Status: DC

## 2018-10-21 MED ORDER — FINASTERIDE 5 MG PO TABS
5.0000 mg | ORAL_TABLET | Freq: Every day | ORAL | Status: DC
  Administered 2018-10-21 – 2018-10-26 (×6): 5 mg via ORAL
  Filled 2018-10-21 (×6): qty 1

## 2018-10-21 MED ORDER — PANTOPRAZOLE SODIUM 40 MG PO TBEC
40.0000 mg | DELAYED_RELEASE_TABLET | Freq: Every day | ORAL | Status: DC
  Administered 2018-10-21 – 2018-10-26 (×6): 40 mg via ORAL
  Filled 2018-10-21 (×6): qty 1

## 2018-10-21 MED ORDER — HYDROCHLOROTHIAZIDE 12.5 MG PO CAPS
12.5000 mg | ORAL_CAPSULE | Freq: Every day | ORAL | Status: DC
  Administered 2018-10-21 – 2018-10-26 (×6): 12.5 mg via ORAL
  Filled 2018-10-21 (×5): qty 1

## 2018-10-21 NOTE — ED Notes (Signed)
Patient will be transported to BMU once COVID test has resulted

## 2018-10-21 NOTE — ED Notes (Signed)
TTS consulted with the pts nurse to reevaluate pts appropriateness  for TTS assessment. Pt nurse states that the pt is inappropriate for assessment at this time due to AMS and somnolence.This Clinical research associate has also spoken with the psychiatric nurse who states that Pt was difficult to understand much of the time and timelines were inconsistent. Pt unable to provide clear history.   Writer was unable to complete a thorough face to face assessment due to AMS. Per Np recommendations pt will be admitted to the BMU.

## 2018-10-21 NOTE — ED Notes (Addendum)
TTS consulted with the pts nurse to reevaluate pts appropriateness  for admission to the BMU. Pt being reviewed for possible admission to [facility]. H&P and Assessment has been provided to the [facility] Unit for the charge nurse to review and provide bed assignment.
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
## 2018-10-21 NOTE — ED Notes (Signed)
Hourly rounding reveals patient in room. No complaints, stable, in no acute distress. Q15 minute rounds and monitoring via Rover and Officer to continue.   

## 2018-10-21 NOTE — BHH Group Notes (Signed)
LCSW Group Therapy Note  10/21/2018 1:00 PM  Type of Therapy/Topic:  Group Therapy:  Emotion Regulation  Participation Level:  Did Not Attend   Description of Group:   The purpose of this group is to assist patients in learning to regulate negative emotions and experience positive emotions. Patients will be guided to discuss ways in which they have been vulnerable to their negative emotions. These vulnerabilities will be juxtaposed with experiences of positive emotions or situations, and patients will be challenged to use positive emotions to combat negative ones. Special emphasis will be placed on coping with negative emotions in conflict situations, and patients will process healthy conflict resolution skills.  Therapeutic Goals: 1. Patient will identify two positive emotions or experiences to reflect on in order to balance out negative emotions 2. Patient will label two or more emotions that they find the most difficult to experience 3. Patient will demonstrate positive conflict resolution skills through discussion and/or role plays  Summary of Patient Progress:  X  Therapeutic Modalities:   Cognitive Behavioral Therapy Feelings Identification Dialectical Behavioral Therapy  [name], MSW, LCSW 10/21/2018 2:23 PM
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
## 2018-10-21 NOTE — H&P (Signed)
Psychiatric Admission Assessment Adult  Patient Identification: Randy Ferguson MRN:  [id number] Date of Evaluation:  10/21/2018 Chief Complaint:  SCHIZOPHRENIA Principal Diagnosis: Schizophrenia, chronic condition (HCC) Diagnosis:  Principal Problem:   Schizophrenia, chronic condition (HCC) Active Problems:   Essential hypertension   Hypothyroidism   Tardive dyskinesia   Thrombocytopenia (HCC)   Prostate hypertrophy  History of Present Illness: Patient seen chart reviewed.  This is a 63 year old man with a history of schizophrenia who was brought here from his group home.  Apparently law enforcement was called because of changes in his behavior.  According to the paperwork patient had become agitated and was walking away from his home inappropriately dressed.  There is also mention of him either trying to set something on fire or threatening to set something on fire.  In interview with the patient today he was not able to give much useful history.  He tells me that he lives "on the street" which is not true.  He denies having threatened or said that he was going to burn down anything.  Denies any hallucinations.  He is not a very forthcoming or thoughtful historian.  Seems to be extremely cognitively impaired.  He tells me that he "misses his auntie" but that he has not seen her in a long time.  Patient does not have any idea what medicine he is taking.  I have left a message at his group home to try and get full clarity about what his current medication profile is.  No evidence of alcohol or drug abuse. Associated Signs/Symptoms: Depression Symptoms:  psychomotor retardation, impaired memory, (Hypo) Manic Symptoms:  Distractibility, Anxiety Symptoms:  Excessive Worry, Psychotic Symptoms:  Delusions, Ideas of Reference, Paranoia, PTSD Symptoms: Negative Total Time spent with patient: 1 hour  Past Psychiatric History: Patient has a long history of chronic mental illness.  We last saw him  at our hospital in 2014.  I do not know whether he has had any other hospitalizations since then.  Patient's primary diagnosis has been schizophrenia although even back in 2014 we noted that he seems to be more cognitively impaired than is usually expected if that were the only diagnosis.  Patient has a history of some agitation in the past no clear history of suicide.  There had been times in the past when he was primarily managed with clozapine.  Back when we saw him in 2014 he was also found to have thrombocytopenia which was thought to possibly be related to his clozapine and so we had been trying to switch him over to another antipsychotic.  I am not sure what his full antipsychotic regimen is now or whether he is still taking clozapine.  Is the patient at risk to self? Yes.    Has the patient been a risk to self in the past 6 months? No.  Has the patient been a risk to self within the distant past? Yes.    Is the patient a risk to others? Yes.    Has the patient been a risk to others in the past 6 months? No.  Has the patient been a risk to others within the distant past? No.   Prior Inpatient Therapy:   Prior Outpatient Therapy:    Alcohol Screening: 1. How often do you have a drink containing alcohol?: Never 2. How many drinks containing alcohol do you have on a typical day when you are drinking?: 1 or 2 3. How often do you have six or more drinks on  one occasion?: Never AUDIT-C Score: 0 4. How often during the last year have you found that you were not able to stop drinking once you had started?: Never 5. How often during the last year have you failed to do what was normally expected from you becasue of drinking?: Never 6. How often during the last year have you needed a first drink in the morning to get yourself going after a heavy drinking session?: Never 7. How often during the last year have you had a feeling of guilt of remorse after drinking?: Never 8. How often during the last  year have you been unable to remember what happened the night before because you had been drinking?: Never 9. Have you or someone else been injured as a result of your drinking?: No 10. Has a relative or friend or a doctor or another health worker been concerned about your drinking or suggested you cut down?: No Alcohol Use Disorder Identification Test Final Score (AUDIT): 0 Alcohol Brief Interventions/Follow-up: Patient Refused, AUDIT Score <7 follow-up not indicated Substance Abuse History in the last 12 months:  No. Consequences of Substance Abuse: Negative Previous Psychotropic Medications: Yes  Psychological Evaluations: Yes  Past Medical History:  Past Medical History:  Diagnosis Date  . GERD (gastroesophageal reflux disease)   . Hypertension   . Schizophrenia (HCC)   . Thrombocytopenia (HCC)   . Thyroid disease    History reviewed. No pertinent surgical history. Family History: History reviewed. No pertinent family history. Family Psychiatric  History: No information available Tobacco Screening: Have you used any form of tobacco in the last 30 days? (Cigarettes, Smokeless Tobacco, Cigars, and/or Pipes): Yes Tobacco use, Select all that apply: 4 or less cigarettes per day Are you interested in Tobacco Cessation Medications?: Yes, will notify MD for an order Counseled patient on smoking cessation including recognizing danger situations, developing coping skills and basic information about quitting provided: Yes Social History:  Social History   Substance and Sexual Activity  Alcohol Use Not Currently     Social History   Substance and Sexual Activity  Drug Use Not on file    Additional Social History:                           Allergies:  No Known Allergies Lab Results:  Results for orders placed or performed during the hospital encounter of 10/20/18 (from the past 48 hour(s))  Comprehensive metabolic panel     Status: Abnormal   Collection Time: 10/20/18  11:14 PM  Result Value Ref Range   Sodium 139 135 - 145 mmol/L   Potassium 4.2 3.5 - 5.1 mmol/L   Chloride 102 98 - 111 mmol/L   CO2 31 22 - 32 mmol/L   Glucose, Bld 84 70 - 99 mg/dL   BUN 21 (H) 6 - 20 mg/dL   Creatinine, Ser 6.04 0.61 - 1.24 mg/dL   Calcium 9.3 8.9 - 54.0 mg/dL   Total Protein 7.9 6.5 - 8.1 g/dL   Albumin 3.9 3.5 - 5.0 g/dL   AST 44 (H) 15 - 41 U/L   ALT 38 0 - 44 U/L   Alkaline Phosphatase 73 38 - 126 U/L   Total Bilirubin 0.4 0.3 - 1.2 mg/dL   GFR calc non Af Amer >60 >60 mL/min   GFR calc Af Amer >60 >60 mL/min   Anion gap 6 5 - 15    Comment: Performed at [facility], [address] [place], [place] [address]  Ethanol     Status: None   Collection Time: 10/20/18 11:14 PM  Result Value Ref Range   Alcohol, Ethyl (B) <10 <10 mg/dL    Comment: (NOTE) Lowest detectable limit for serum alcohol is 10 mg/dL. For medical purposes only. Performed at [facility], [address] [place], [place] [address]   cbc     Status: Abnormal   Collection Time: 10/20/18 11:14 PM  Result Value Ref Range   WBC 5.9 4.0 - 10.5 K/uL   RBC 3.89 (L) 4.22 - 5.81 MIL/uL   Hemoglobin 12.8 (L) 13.0 - 17.0 g/dL   HCT 19.139.5 47.839.0 - 29.552.0 %   MCV 101.5 (H) 80.0 - 100.0 fL   MCH 32.9 26.0 - 34.0 pg   MCHC 32.4 30.0 - 36.0 g/dL   RDW 62.114.6 30.811.5 - 65.715.5 %   Platelets 74 (L) 150 - 400 K/uL    Comment: PLATELET COUNT CONFIRMED BY SMEAR Immature Platelet Fraction may be clinically indicated, consider ordering this additional test [id number]    nRBC 0.0 0.0 - 0.2 %    Comment: Performed at [facility], [address] [place], [place] [address]  Valproic acid level     Status: None   Collection Time: 10/20/18 11:14 PM  Result Value Ref Range   Valproic Acid Lvl 52 50.0 - 100.0 ug/mL    Comment: Performed at [facility], [address] [place], [place] [address]  SARS Coronavirus 2 (CEPHEID - Performed in [facility] hospital lab), Hosp Order      Status: None   Collection Time: 10/21/18  8:33 AM  Result Value Ref Range   SARS Coronavirus 2 NEGATIVE NEGATIVE    Comment: (NOTE) If result is NEGATIVE SARS-CoV-2 target nucleic acids are NOT DETECTED. The SARS-CoV-2 RNA is generally detectable in upper and lower  respiratory specimens during the acute phase of infection. The lowest  concentration of SARS-CoV-2 viral copies this assay can detect is 250  copies / mL. A negative result does not preclude SARS-CoV-2 infection  and should not be used as the sole basis for treatment or other  patient management decisions.  A negative result may occur with  improper specimen collection / handling, submission of specimen other  than nasopharyngeal swab, presence of viral mutation(s) within the  areas targeted by this assay, and inadequate number of viral copies  (<250 copies / mL). A negative result must be combined with clinical  observations, patient history, and epidemiological information. If result is POSITIVE SARS-CoV-2 target nucleic acids are DETECTED. The SARS-CoV-2 RNA is generally detectable in upper and lower  respiratory specimens dur ing the acute phase of infection.  Positive  results are indicative of active infection with SARS-CoV-2.  Clinical  correlation with patient history and other diagnostic information is  necessary to determine patient infection status.  Positive results do  not rule out bacterial infection or co-infection with other viruses. If result is PRESUMPTIVE POSTIVE SARS-CoV-2 nucleic acids MAY BE PRESENT.   A presumptive positive result was obtained on the submitted specimen  and confirmed on repeat testing.  While 2019 novel coronavirus  (SARS-CoV-2) nucleic acids may be present in the submitted sample  additional confirmatory testing may be necessary for epidemiological  and / or clinical management purposes  to differentiate between  SARS-CoV-2 and other Sarbecovirus currently known to infect  humans.  If clinically indicated additional testing with an alternate test  methodology [phone]([id number]) is advised.  The SARS-CoV-2 RNA is generally  detectable in upper and lower respiratory sp ecimens during the acute  phase of infection. The expected result is Negative. Fact Sheet for Patients:  BoilerBrush.com.cy Fact Sheet for Healthcare Providers: https://pope.com/ This test is not yet approved or cleared by the [place] FDA and has been authorized for detection and/or diagnosis of SARS-CoV-2 by FDA under an Emergency Use Authorization (EUA).  This EUA will remain in effect (meaning this test can be used) for the duration of the COVID-19 declaration under Section 564(b)(1) of the Act, 21 U.S.C. section 360bbb-3(b)(1), unless the authorization is terminated or revoked sooner. Performed at [facility], [address] [place], [place] [address]     Blood Alcohol level:  Lab Results  Component Value Date   [employer] <10 10/20/2018   ETH <10 11/23/2017    Metabolic Disorder Labs:  No results found for: HGBA1C, MPG No results found for: PROLACTIN No results found for: CHOL, TRIG, HDL, CHOLHDL, VLDL, LDLCALC  Current Medications: Current Facility-Administered Medications  Medication Dose Route Frequency Provider Last Rate Last Dose  . acetaminophen (TYLENOL) tablet 650 mg  650 mg Oral Q6H PRN Catalina Gravel, NP      . alum & mag hydroxide-simeth (MAALOX/MYLANTA) 200-200-20 MG/5ML suspension 30 mL  30 mL Oral Q4H PRN Catalina Gravel, NP      . clonazePAM (KLONOPIN) tablet 1 mg  1 mg Oral TID ,  T, MD      . docusate sodium (COLACE) capsule 100 mg  100 mg Oral BID Catalina Gravel, NP      . finasteride (PROSCAR) tablet 5 mg  5 mg Oral Daily Thomspon, Adela Lank, NP      . folic acid (FOLVITE) tablet 1 mg  1 mg Oral Daily Thomspon, Adela Lank, NP      . lisinopril (ZESTRIL) tablet 10 mg  10 mg Oral  Daily Catalina Gravel, NP       And  . hydrochlorothiazide (MICROZIDE) capsule 12.5 mg  12.5 mg Oral Daily Catalina Gravel, NP      . Melene Muller ON 10/22/2018] levothyroxine (SYNTHROID) tablet 137 mcg  137 mcg Oral QAC breakfast Thomspon, Adela Lank, NP      . magnesium hydroxide (MILK OF MAGNESIA) suspension 30 mL  30 mL Oral Daily PRN Catalina Gravel, NP      . Melatonin TABS 5 mg  1 tablet Oral QHS Thomspon, Adela Lank, NP      . oxybutynin (DITROPAN) tablet 5 mg  5 mg Oral BID Catalina Gravel, NP      . pantoprazole (PROTONIX) EC tablet 40 mg  40 mg Oral Daily Thomspon, Adela Lank, NP      . tamsulosin (FLOMAX) capsule 0.4 mg  0.4 mg Oral Daily Thomspon, Adela Lank, NP      . traZODone (DESYREL) tablet 150 mg  150 mg Oral QHS Thomspon, Adela Lank, NP      . vitamin B-12 (CYANOCOBALAMIN) tablet 1,000 mcg  1,000 mcg Oral UD Thomspon, Jacqueline, NP       PTA Medications: Medications Prior to Admission  Medication Sig Dispense Refill Last Dose  . ARIPiprazole ER (ABILIFY MAINTENA) 400 MG SRER injection Inject 400 mg into the muscle every 28 (twenty-eight) days.   Past Month at Unknown time  . benztropine (COGENTIN) 2 MG tablet Take 2 mg by mouth 2 (two) times daily.   10/20/2018 at 2000  . clonazePAM (KLONOPIN) 0.5 MG tablet Take 0.5 mg by mouth daily as needed for anxiety.   prn at prn  .  clonazePAM (KLONOPIN) 1 MG tablet Take 1 mg by mouth 2 (two) times daily.   10/20/2018 at 2000  . cloZAPine (CLOZARIL) 100 MG tablet Take 100-300 mg by mouth daily. Take 2 tablets in the am and 3 tablets qhs   10/20/2018 at 2000  . divalproex (DEPAKOTE) 500 MG DR tablet Take 500 mg by mouth 3 (three) times daily.   10/20/2018 at 2000  . docusate sodium (COLACE) 100 MG capsule Take 100 mg by mouth 2 (two) times daily.   10/20/2018 at 0800  . finasteride (PROSCAR) 5 MG tablet Take 5 mg by mouth daily.   10/20/2018 at 0800  . folic acid (FOLVITE) 1 MG tablet Take 1 mg by mouth daily.   10/20/2018 at  0800  . levothyroxine (SYNTHROID) 137 MCG tablet Take 137 mcg by mouth daily before breakfast.   10/20/2018 at 0800  . lisinopril-hydrochlorothiazide (ZESTORETIC) 10-12.5 MG tablet Take 1 tablet by mouth daily.   10/20/2018 at 0800  . Melatonin 5 MG TABS Take 1 tablet by mouth at bedtime.   10/20/2018 at 2000  . omeprazole (PRILOSEC) 20 MG capsule Take 20 mg by mouth daily.   10/20/2018 at 0800  . oxybutynin (DITROPAN) 5 MG tablet Take 5 mg by mouth 2 (two) times daily.   10/20/2018 at 2000  . tamsulosin (FLOMAX) 0.4 MG CAPS capsule Take 0.4 mg by mouth daily.   10/20/2018 at 0800  . traZODone (DESYREL) 150 MG tablet Take 150 mg by mouth at bedtime.   10/20/2018 at 2000  . vitamin B-12 (CYANOCOBALAMIN) 1000 MCG tablet Take 1,000 mcg by mouth as directed. Take one tablet on Monday, weds, fri and sat   Past Week at 0800    Musculoskeletal: Strength & Muscle Tone: within normal limits Gait & Station: unsteady Patient leans: N/A  Psychiatric Specialty Exam: Physical Exam  Nursing note and vitals reviewed. Constitutional: He appears well-developed and well-nourished.  HENT:  Head: Normocephalic and atraumatic.  Eyes: Pupils are equal, round, and reactive to light. Conjunctivae are normal.  Neck: Normal range of motion.  Cardiovascular: Regular rhythm and normal heart sounds.  Respiratory: Effort normal. No respiratory distress.  GI: Soft.  Musculoskeletal: Normal range of motion.  Neurological: He is alert.  Diffuse tardive dyskinesia particularly notable in the face  Skin: Skin is warm and dry.  Psychiatric: His affect is blunt. His speech is delayed and slurred. He is slowed. He is not agitated and not aggressive. Thought content is delusional. Cognition and memory are impaired. He expresses inappropriate judgment. He expresses no homicidal and no suicidal ideation. He is noncommunicative.    Review of Systems  Constitutional: Negative.   HENT: Negative.   Eyes: Negative.   Respiratory:  Negative.   Cardiovascular: Negative.   Gastrointestinal: Negative.   Musculoskeletal: Negative.   Skin: Negative.   Neurological: Negative.   Psychiatric/Behavioral: Negative for depression, hallucinations, memory loss, substance abuse and suicidal ideas. The patient is not nervous/anxious and does not have insomnia.     Blood pressure 138/74, pulse 89, temperature 98.7 F (37.1 C), temperature source Oral, resp. rate 18, height  (1.753 m), weight 77.1 kg, SpO2 98 %.Body mass index is 25.1 kg/m.  General Appearance: Disheveled  Eye Contact:  Minimal  Speech:  Slow and Slurred  Volume:  Decreased  Mood:  Euthymic  Affect:  Flat  Thought Process:  Disorganized  Orientation:  Negative  Thought Content:  Illogical, Rumination and Tangential  Suicidal Thoughts:  No  Homicidal Thoughts:  No  Memory:  Negative  Judgement:  Impaired  Insight:  Lacking  Psychomotor Activity:  Decreased  Concentration:  Concentration: Poor  Recall:  Poor  Fund of Knowledge:  Poor  Language:  Poor  Akathisia:  No  Handed:  Right  AIMS (if indicated):     Assets:  Financial Resources/Insurance Housing Social Support  ADL's:  Intact  Cognition:  Impaired,  Moderate  Sleep:       Treatment Plan Summary: Daily contact with patient to assess and evaluate symptoms and progress in treatment, Medication management and Plan Patient with schizophrenia who evidently had been evidencing problematic behavior out of the norm for him.  Unclear what is causing this.  No evidence of substance abuse.  Uncertain whether he has had any changes to his medicine.  I have put in a call to his group home to try and find out what medication he is taking.  According to the notes he was on long-acting Abilify shots which were reportedly up-to-date.  As a result we have not ordered any antipsychotic for him here in the hospital but have continued his Klonopin which is been a chronic medication.  Patient is currently showing  thrombocytopenia again possibly related to clozapine dosing.  No TSH was done on admission and I am going to recheck that.  No Clozaril level was done on admission and we will check that.  We will try and get collateral information put him on appropriate medicine and hope that he can be discharged back home relatively soon as his cognitive impairment will make him unlikely to benefit from much cognitive educational programming.  Observation Level/Precautions:  15 minute checks  Laboratory:  TSH  Psychotherapy:    Medications:    Consultations:    Discharge Concerns:    Estimated LOS:  Other:     Physician Treatment Plan for Primary Diagnosis: Schizophrenia, chronic condition (HCC) Long Term Goal(s): Improvement in symptoms so as ready for discharge  Short Term Goals: Ability to demonstrate self-control will improve  Physician Treatment Plan for Secondary Diagnosis: Principal Problem:   Schizophrenia, chronic condition (HCC) Active Problems:   Essential hypertension   Hypothyroidism   Tardive dyskinesia   Thrombocytopenia (HCC)   Prostate hypertrophy  Long Term Goal(s): Improvement in symptoms so as ready for discharge  Short Term Goals: Compliance with prescribed medications will improve  I certify that inpatient services furnished can reasonably be expected to improve the patient's condition.    Mordecai Rasmussen, MD 5/13/20204:19 PM

## 2018-10-21 NOTE — ED Notes (Signed)
Hourly rounding reveals patient sleeping in room. No complaints, stable, in no acute distress. Q15 minute rounds and monitoring via Rover and Officer to continue.  

## 2018-10-21 NOTE — Tx Team (Signed)
Initial Treatment Plan 10/21/2018 2:06 PM [name] MGN:[id number]    PATIENT STRESSORS: Medication change or noncompliance Other: living situation   PATIENT STRENGTHS: Motivation for treatment/growth Supportive family/friends   PATIENT IDENTIFIED PROBLEMS: Impaired social interaction  Impulse control disorder  Ineffective coping                 DISCHARGE CRITERIA:  Ability to meet basic life and health needs Improved stabilization in mood, thinking, and/or behavior Motivation to continue treatment in a less acute level of care Verbal commitment to aftercare and medication compliance  PRELIMINARY DISCHARGE PLAN: Attend aftercare/continuing care group Return to previous living arrangement Return to previous work or school arrangements  PATIENT/FAMILY INVOLVEMENT: This treatment plan has been presented to and reviewed with the patient, Randy Ferguson, and/or family member.  The patient and family have been given the opportunity to ask questions and make suggestions.  Leamon Arnt, RN 10/21/2018, 2:06 PM

## 2018-10-21 NOTE — ED Notes (Addendum)
Patient given a sprite

## 2018-10-21 NOTE — ED Notes (Signed)
Walked COVID-19 swab to the lab.

## 2018-10-21 NOTE — ED Notes (Signed)
Patient being transferred to [facility] by staff and officer, patient received papers. Patient got belongings and verbalized he has received all of his belongings. Patient appropriate and cooperative, Denies SI/HI AVH. Vital signs taken. NAD noted.

## 2018-10-21 NOTE — ED Notes (Signed)
Patient ate 100% breakfast.

## 2018-10-21 NOTE — ED Notes (Addendum)
Patient is to be admitted to [facility] BMU by Dr. Chesley Noon  .  Attending Physician will be Dr. Toni Amend.   Patient has been assigned to room 307, by [facility] Charge Nurse Alex.   Intake Paper Work has been signed and placed on patient chart.  ER staff is aware of the admission:  ER Secretary    Dr.  ER MD   Jillyn Hidden Patient's Nurse   Tresa Endo  Patient Access.  Pt to be transferred after 8AM

## 2018-10-21 NOTE — ED Notes (Signed)
BEHAVIORAL HEALTH ROUNDING Patient sleeping: No. Patient alert and oriented: yes Behavior appropriate: Yes.  ; If no, describe:  Nutrition and fluids offered: yes Toileting and hygiene offered: Yes  Sitter present: q15 minute observations and security monitoring Law enforcement present: Yes    

## 2018-10-22 LAB — CLOZAPINE (CLOZARIL)
Clozapine Lvl: 722 ng/mL — ABNORMAL HIGH (ref 350–650)
NorClozapine: 206 ng/mL
Total(Cloz+Norcloz): 928 ng/mL

## 2018-10-22 MED ORDER — LEVOTHYROXINE SODIUM 100 MCG PO TABS
200.0000 ug | ORAL_TABLET | Freq: Every day | ORAL | Status: DC
  Administered 2018-10-23 – 2018-10-25 (×3): 200 ug via ORAL
  Filled 2018-10-22 (×4): qty 2

## 2018-10-22 NOTE — Progress Notes (Signed)
Recreation Therapy Notes  Date: 10/22/2018  Time: 9:30 am  Location: Craft room  Behavioral response: Appropriate  Intervention Topic: Emotions  Discussion/Intervention:  Group content on today was focused on emotions. The group identified what emotions are and why it is important to have emotions. Patients expressed some positive and negative emotions. Individuals gave some past experiences on how they normally dealt with emotions in the past. The group described some positive ways to deal with emotions in the future. Patients participated in the intervention "Name the [name]" where individuals were given a chance to experience different emotions.    Clinical Observations/Feedback:  Patient came to group and was focused on what peers and staff had to say about emotions. Individual was social with staff while engaging in the intervention. [name] LRT/CTRS         Runell Kovich 10/22/2018 12:13 PM

## 2018-10-22 NOTE — Plan of Care (Signed)
Patient newly admitted, hasn't had time to progress.   Problem: Education: Goal: Knowledge of [facility] Education information/materials will improve Outcome: Not Progressing Goal: Emotional status will improve Outcome: Not Progressing Goal: Mental status will improve Outcome: Not Progressing Goal: Verbalization of understanding the information provided will improve Outcome: Not Progressing   Problem: Self-Concept: Goal: Will verbalize positive feelings about self Outcome: Not Progressing

## 2018-10-22 NOTE — BHH Counselor (Signed)
CSW spoke with legal guardian, Cathlean Cower, [phone], who will email the guardianship paperwork.  CSW will then follow up with consents and releases to speak with collaterals and group home about returning to the home.   [name], MSW, LCSW 10/22/2018 3:43 PM

## 2018-10-22 NOTE — [facility] Suicide Risk Assessment (Signed)
[facility] INPATIENT:  Family/Significant Other Suicide Prevention Education  Suicide Prevention Education:  Contact Attempts: Cathlean Cower, legal guardian, [phone] has been identified by the patient as the family member/significant other with whom the patient will be residing, and identified as the person(s) who will aid the patient in the event of a mental health crisis.  With written consent from the patient, two attempts were made to provide suicide prevention education, prior to and/or following the patient's discharge.  We were unsuccessful in providing suicide prevention education.  A suicide education pamphlet was given to the patient to share with family/significant other.  Date and time of first attempt: 10/22/2018 at 11:08AM Date and time of second attempt: Second attempt is needed.  Harden Mo 10/22/2018, 11:08 AM
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
## 2018-10-22 NOTE — Progress Notes (Signed)
[facility]  10/22/2018 3:26 PM Shayne AlkenKenneth Dolley  MRN:  [id number] Subjective: Patient seen and chart reviewed.  Patient able to offer a little coherent information.  Tells me he is feeling tired.  Not able to coherently answer about hallucinations.  Denies suicidal or homicidal ideation.  Patient is able to basically take care of his ADLs and function with redirection from staff.  Has not been aggressive violent or threatening.  Checked results of some labs.  TSH is over 30 indicating profound hypothyroidism.  I am going to recheck his platelet count today. Principal Problem: Schizophrenia, chronic condition (HCC) Diagnosis: Principal Problem:   Schizophrenia, chronic condition (HCC) Active Problems:   Essential hypertension   Hypothyroidism   Tardive dyskinesia   Thrombocytopenia (HCC)   Prostate hypertrophy  Total Time spent with patient: 30 minutes  Past Psychiatric History: Patient has a long history of chronic mental illness and chronic poor functioning  Past Medical History:  Past Medical History:  Diagnosis Date  . GERD (gastroesophageal reflux disease)   . Hypertension   . Schizophrenia (HCC)   . Thrombocytopenia (HCC)   . Thyroid disease    History reviewed. No pertinent surgical history. Family History: History reviewed. No pertinent family history. Family Psychiatric  History: Unknown Social History:  Social History   Substance and Sexual Activity  Alcohol Use Not Currently     Social History   Substance and Sexual Activity  Drug Use Not on file    Social History   Socioeconomic History  . Marital status: Unknown    Spouse name: Not on file  . Number of children: Not on file  . Years of education: Not on file  . Highest education level: Not on file  Occupational History  . Not on file  Social Needs  . Financial resource strain: Not on file  . Food insecurity:    Worry: Not on file    Inability: Not on file  . Transportation needs:   Medical: Not on file    Non-medical: Not on file  Tobacco Use  . Smoking status: Current Every Day Smoker    Packs/day: 0.50    Types: Cigarettes  . Smokeless tobacco: Never Used  Substance and Sexual Activity  . Alcohol use: Not Currently  . Drug use: Not on file  . Sexual activity: Not on file  Lifestyle  . Physical activity:    Days per week: Not on file    Minutes per session: Not on file  . Stress: Not on file  Relationships  . Social connections:    Talks on phone: Not on file    Gets together: Not on file    Attends religious service: Not on file    Active member of club or organization: Not on file    Attends meetings of clubs or organizations: Not on file    Relationship status: Not on file  Other Topics Concern  . Not on file  Social History Narrative  . Not on file   Additional Social History:                         Sleep: Fair  Appetite:  Fair  Current Medications: Current Facility-Administered Medications  Medication Dose Route Frequency Provider Last Rate Last Dose  . acetaminophen (TYLENOL) tablet 650 mg  650 mg Oral Q6H PRN Catalina Gravelhomspon, Jacqueline, NP      . alum & mag hydroxide-simeth (MAALOX/MYLANTA) 200-200-20 MG/5ML suspension 30 mL  30 mL Oral Q4H PRN Catalina Gravel, NP      . clonazePAM (KLONOPIN) tablet 1 mg  1 mg Oral TID Clapacs, Jackquline Denmark, MD   1 mg at 10/22/18 1158  . docusate sodium (COLACE) capsule 100 mg  100 mg Oral BID Catalina Gravel, NP   100 mg at 10/22/18 0808  . finasteride (PROSCAR) tablet 5 mg  5 mg Oral Daily Catalina Gravel, NP   5 mg at 10/22/18 1610  . folic acid (FOLVITE) tablet 1 mg  1 mg Oral Daily Thomspon, Adela Lank, NP   1 mg at 10/22/18 9604  . lisinopril (ZESTRIL) tablet 10 mg  10 mg Oral Daily Catalina Gravel, NP   10 mg at 10/22/18 5409   And  . hydrochlorothiazide (MICROZIDE) capsule 12.5 mg  12.5 mg Oral Daily Catalina Gravel, NP   12.5 mg at 10/22/18 0808  . [START ON 10/23/2018]  levothyroxine (SYNTHROID) tablet 200 mcg  200 mcg Oral QAC breakfast Clapacs, John T, MD      . magnesium hydroxide (MILK OF MAGNESIA) suspension 30 mL  30 mL Oral Daily PRN Catalina Gravel, NP      . Melatonin TABS 5 mg  1 tablet Oral QHS Catalina Gravel, NP   5 mg at 10/21/18 2137  . oxybutynin (DITROPAN) tablet 5 mg  5 mg Oral BID Catalina Gravel, NP   5 mg at 10/22/18 0813  . pantoprazole (PROTONIX) EC tablet 40 mg  40 mg Oral Daily Catalina Gravel, NP   40 mg at 10/22/18 0809  . tamsulosin (FLOMAX) capsule 0.4 mg  0.4 mg Oral Daily Catalina Gravel, NP   0.4 mg at 10/22/18 0808  . traZODone (DESYREL) tablet 150 mg  150 mg Oral QHS Catalina Gravel, NP   150 mg at 10/21/18 2137  . vitamin B-12 (CYANOCOBALAMIN) tablet 1,000 mcg  1,000 mcg Oral UD Catalina Gravel, NP   1,000 mcg at 10/22/18 8119    Lab Results:  Results for orders placed or performed during the hospital encounter of 10/21/18 (from the past 48 hour(s))  TSH     Status: Abnormal   Collection Time: 10/21/18  6:13 PM  Result Value Ref Range   TSH 32.451 (H) 0.350 - 4.500 uIU/mL    Comment: Performed by a 3rd Generation assay with a functional sensitivity of <=0.01 uIU/mL. Performed at [facility], [address] [place], [place] [address]     Blood Alcohol level:  Lab Results  Component Value Date   [facility] <10 10/20/2018   ETH <10 11/23/2017    Metabolic Disorder Labs: No results found for: HGBA1C, MPG No results found for: PROLACTIN No results found for: CHOL, TRIG, HDL, CHOLHDL, VLDL, LDLCALC  Physical Findings: AIMS:  , ,  ,  ,    CIWA:    COWS:     Musculoskeletal: Strength & Muscle Tone: within normal limits Gait & Station: normal Patient leans: N/A  Psychiatric Specialty Exam: Physical Exam  Nursing note and vitals reviewed. Constitutional: He appears well-developed and well-nourished.  HENT:  Head: Normocephalic and atraumatic.  Eyes: Pupils are equal,  round, and reactive to light. Conjunctivae are normal.  Neck: Normal range of motion.  Cardiovascular: Regular rhythm and normal heart sounds.  Respiratory: Effort normal. No respiratory distress.  GI: Soft.  Musculoskeletal: Normal range of motion.  Neurological: He is alert.  Skin: Skin is warm and dry.  Psychiatric: His affect is blunt. His speech is delayed. He is slowed. Thought content is delusional.  Cognition and memory are impaired. He expresses inappropriate judgment.    Review of Systems  Constitutional: Positive for malaise/fatigue.  HENT: Negative.   Eyes: Negative.   Respiratory: Negative.   Cardiovascular: Negative.   Gastrointestinal: Negative.   Musculoskeletal: Negative.   Skin: Negative.   Neurological: Negative.   Psychiatric/Behavioral: Negative.     Blood pressure 138/74, pulse 89, temperature 98.7 F (37.1 C), temperature source Oral, resp. rate 18, height 5\' 9"  (1.753 m), weight 77.1 kg, SpO2 98 %.Body mass index is 25.1 kg/m.  General Appearance: Disheveled  Eye Contact:  Minimal  Speech:  Slow  Volume:  Decreased  Mood:  Euthymic  Affect:  Flat  Thought Process:  Disorganized  Orientation:  Negative  Thought Content:  Illogical  Suicidal Thoughts:  No  Homicidal Thoughts:  No  Memory:  Negative  Judgement:  Negative  Insight:  Negative  Psychomotor Activity:  Negative  Concentration:  Concentration: Negative  Recall:  Negative  Fund of Knowledge:  Negative  Language:  Negative  Akathisia:  No  Handed:  Right  AIMS (if indicated):     Assets:  Desire for Improvement Housing Resilience Social Support  ADL's:  Impaired  Cognition:  Impaired,  Moderate and Severe  Sleep:  Number of Hours: 3.5     Treatment Plan Summary: Daily contact with patient to assess and evaluate symptoms and progress in treatment, Medication management and Plan Patient has not shown any aggression here in the hospital.  Behavior has been calm.  As noted above his  TSH is very elevated indicating extreme hypothyroidism.  Probably not on his thyroid medicine at all recently.  I am going to increase the dose here up to 200 mcg which was his previous dose.  We have held off on restarting clozapine because of the concern about thrombocytopenia.  It is thought that he is on an Abilify long-acting injection.  Otherwise using Klonopin here as primary medication.  Patient has not been bizarre or aggressive.  Likely will be able to go home soon.  I am rechecking his platelets this afternoon.  Mordecai Rasmussen, MD 10/22/2018, 3:26 PM

## 2018-10-22 NOTE — Progress Notes (Signed)
D - Patient was in his room upon arrival to the unit. Patient was pleasant during assessment and medication administration. Patient denies SI/HI/AVH, pain, anxiety and depression with this Clinical research associate. Patient presents with a childlike affect. Patient continually came up to the nurses station starting at 0230 asking if he could get coffee, asking for money, asking if he can walk the halls. Patient given education.   A - Patient was compliant with medications per MD orders but possibly needs stronger medications to help him sleep through the night. Patient given education. Patient given support and encouragement to be active in his treatment plan. Patient informed to let staff know if there are any issues or problems on the unit.   R - Patient being monitored Q 15 minutes for safety per unit protocol. Patient remains safe on the unit.

## 2018-10-22 NOTE — Plan of Care (Signed)
RN feels patient is at his baseline. Patient not having outburst or aggression towards peers or staff on the unit. Patient takes medications appropriately. Patient eats meals well. Patient did not sleep well last night according to Nursing report. Patient up since 2 am. Safety checks to continue Q 15 minutes. Problem: Education: Goal: Knowledge of [address] [facility] Education information/materials will improve Outcome: Not Progressing Goal: Emotional status will improve Outcome: Not Progressing Goal: Mental status will improve Outcome: Not Progressing Goal: Verbalization of understanding the information provided will improve Outcome: Not Progressing   Problem: Self-Concept: Goal: Will verbalize positive feelings about self Outcome: Not Progressing

## 2018-10-22 NOTE — [facility] Suicide Risk Assessment (Signed)
[facility] INPATIENT:  Family/Significant Other Suicide Prevention Education  Suicide Prevention Education:  Education Completed; legal guardian, Cathlean Cower, [phone] has been identified by the patient as the family member/significant other with whom the patient will be residing, and identified as the person(s) who will aid the patient in the event of a mental health crisis (suicidal ideations/suicide attempt).  With written consent from the patient, the family member/significant other has been provided the following suicide prevention education, prior to the and/or following the discharge of the patient.  The suicide prevention education provided includes the following:  Suicide risk factors  Suicide prevention and interventions  National Suicide Hotline telephone number  [facility] assessment telephone number  [facility] Emergency Assistance 911  [facility] and/or Residential Mobile Crisis Unit telephone number  Request made of family/significant other to:  Remove weapons (e.g., guns, rifles, knives), all items previously/currently identified as safety concern.    Remove drugs/medications (over-the-counter, prescriptions, illicit drugs), all items previously/currently identified as a safety concern.  The family member/significant other verbalizes understanding of the suicide prevention education information provided.  The family member/significant other agrees to remove the items of safety concern listed above.  Harden Mo 10/22/2018, 3:41 PM

## 2018-10-22 NOTE — [facility] Group Notes (Signed)
Balance In Life 10/22/2018 1PM  Type of Therapy/Topic:  Group Therapy:  Balance in Life  Participation Level:  Minimal  Description of Group:   This group will address the concept of balance and how it feels and looks when one is unbalanced. Patients will be encouraged to process areas in their lives that are out of balance and identify reasons for remaining unbalanced. Facilitators will guide patients in utilizing problem-solving interventions to address and correct the stressor making their life unbalanced. Understanding and applying boundaries will be explored and addressed for obtaining and maintaining a balanced life. Patients will be encouraged to explore ways to assertively make their unbalanced needs known to significant others in their lives, using other group members and facilitator for support and feedback.  Therapeutic Goals: 1. Patient will identify two or more emotions or situations they have that consume much of in their lives. 2. Patient will identify signs/triggers that life has become out of balance:  3. Patient will identify two ways to set boundaries in order to achieve balance in their lives:  4. Patient will demonstrate ability to communicate their needs through discussion and/or role plays  Summary of Patient Progress:  Minimal participation during session. Pt talking to others while group in session, had to be redirected several times.  Therapeutic Modalities:   Cognitive Behavioral Therapy Solution-Focused Therapy Assertiveness Training  Randy Adelson Philip Aspen, LCSW

## 2018-10-23 LAB — CBC WITH DIFFERENTIAL/PLATELET
Abs Immature Granulocytes: 0.02 10*3/uL (ref 0.00–0.07)
Basophils Absolute: 0 10*3/uL (ref 0.0–0.1)
Basophils Relative: 0 %
Eosinophils Absolute: 0.1 10*3/uL (ref 0.0–0.5)
Eosinophils Relative: 1 %
HCT: 35.6 % — ABNORMAL LOW (ref 39.0–52.0)
Hemoglobin: 11.5 g/dL — ABNORMAL LOW (ref 13.0–17.0)
Immature Granulocytes: 0 %
Lymphocytes Relative: 48 %
Lymphs Abs: 2.5 10*3/uL (ref 0.7–4.0)
MCH: 32.6 pg (ref 26.0–34.0)
MCHC: 32.3 g/dL (ref 30.0–36.0)
MCV: 100.8 fL — ABNORMAL HIGH (ref 80.0–100.0)
Monocytes Absolute: 0.6 10*3/uL (ref 0.1–1.0)
Monocytes Relative: 11 %
Neutro Abs: 2.1 10*3/uL (ref 1.7–7.7)
Neutrophils Relative %: 40 %
Platelets: 78 10*3/uL — ABNORMAL LOW (ref 150–400)
RBC: 3.53 MIL/uL — ABNORMAL LOW (ref 4.22–5.81)
RDW: 14.7 % (ref 11.5–15.5)
WBC: 5.3 10*3/uL (ref 4.0–10.5)
nRBC: 0 % (ref 0.0–0.2)

## 2018-10-23 MED ORDER — QUETIAPINE FUMARATE 100 MG PO TABS
100.0000 mg | ORAL_TABLET | Freq: Every day | ORAL | Status: DC
  Administered 2018-10-23 – 2018-10-25 (×3): 100 mg via ORAL
  Filled 2018-10-23 (×3): qty 1

## 2018-10-23 MED ORDER — DIVALPROEX SODIUM 500 MG PO DR TAB
500.0000 mg | DELAYED_RELEASE_TABLET | Freq: Three times a day (TID) | ORAL | Status: DC
  Administered 2018-10-23 – 2018-10-26 (×8): 500 mg via ORAL
  Filled 2018-10-23 (×9): qty 1

## 2018-10-23 MED ORDER — LEVOTHYROXINE SODIUM 200 MCG PO TABS: 200.0000 ug | ORAL_TABLET | Freq: Every day | ORAL | 0 refills | Status: AC

## 2018-10-23 MED ORDER — CLONAZEPAM 1 MG PO TABS: 1.0000 mg | ORAL_TABLET | Freq: Three times a day (TID) | ORAL | 0 refills | Status: DC

## 2018-10-23 MED ORDER — TRAZODONE HCL 150 MG PO TABS: 150.0000 mg | ORAL_TABLET | Freq: Every day | ORAL | 0 refills | Status: AC

## 2018-10-23 MED ORDER — LISINOPRIL-HYDROCHLOROTHIAZIDE 10-12.5 MG PO TABS: 1.0000 | ORAL_TABLET | Freq: Every day | ORAL | 0 refills | Status: DC

## 2018-10-23 NOTE — Progress Notes (Signed)
[facility] MD Progress Note  10/23/2018 12:24 PM Kailas Fryberger  MRN:  [id number] Subjective: Patient seen chart reviewed.  After making a decision to discharge him his group home was contacted and expressed a higher degree of concern.  Wanted him to sleep more soundly through the night.  Given that the patient is not particularly enthusiastic about discharge and is still a little confused although I do not think he is acutely dangerous we can wait until Monday. Principal Problem: Schizophrenia, chronic condition (HCC) Diagnosis: Principal Problem:   Schizophrenia, chronic condition (HCC) Active Problems:   Essential hypertension   Hypothyroidism   Tardive dyskinesia   Thrombocytopenia (HCC)   Prostate hypertrophy  Total Time spent with patient: 20 minutes  Past Psychiatric History: Patient has a long history of chronic mental illness and intellectual impairment  Past Medical History:  Past Medical History:  Diagnosis Date  . GERD (gastroesophageal reflux disease)   . Hypertension   . Schizophrenia (HCC)   . Thrombocytopenia (HCC)   . Thyroid disease    History reviewed. No pertinent surgical history. Family History: History reviewed. No pertinent family history. Family Psychiatric  History: See previous Social History:  Social History   Substance and Sexual Activity  Alcohol Use Not Currently     Social History   Substance and Sexual Activity  Drug Use Not on file    Social History   Socioeconomic History  . Marital status: Unknown    Spouse name: Not on file  . Number of children: Not on file  . Years of education: Not on file  . Highest education level: Not on file  Occupational History  . Not on file  Social Needs  . Financial resource strain: Not on file  . Food insecurity:    Worry: Not on file    Inability: Not on file  . Transportation needs:    Medical: Not on file    Non-medical: Not on file  Tobacco Use  . Smoking status: Current Every Day Smoker   Packs/day: 0.50    Types: Cigarettes  . Smokeless tobacco: Never Used  Substance and Sexual Activity  . Alcohol use: Not Currently  . Drug use: Not on file  . Sexual activity: Not on file  Lifestyle  . Physical activity:    Days per week: Not on file    Minutes per session: Not on file  . Stress: Not on file  Relationships  . Social connections:    Talks on phone: Not on file    Gets together: Not on file    Attends religious service: Not on file    Active member of club or organization: Not on file    Attends meetings of clubs or organizations: Not on file    Relationship status: Not on file  Other Topics Concern  . Not on file  Social History Narrative  . Not on file   Additional Social History:                         Sleep: Fair  Appetite:  Fair  Current Medications: Current Facility-Administered Medications  Medication Dose Route Frequency Provider Last Rate Last Dose  . acetaminophen (TYLENOL) tablet 650 mg  650 mg Oral Q6H PRN Catalina Gravel, NP      . alum & mag hydroxide-simeth (MAALOX/MYLANTA) 200-200-20 MG/5ML suspension 30 mL  30 mL Oral Q4H PRN Thomspon, Adela Lank, NP      . clonazePAM (KLONOPIN) tablet 1 mg  1 mg Oral TID Malania Gawthrop, Jackquline Denmark, MD   1 mg at 10/23/18 0854  . divalproex (DEPAKOTE) DR tablet 500 mg  500 mg Oral TID Carmyn Hamm T, MD      . docusate sodium (COLACE) capsule 100 mg  100 mg Oral BID Catalina Gravel, NP   100 mg at 10/23/18 0854  . finasteride (PROSCAR) tablet 5 mg  5 mg Oral Daily Catalina Gravel, NP   5 mg at 10/23/18 0855  . folic acid (FOLVITE) tablet 1 mg  1 mg Oral Daily Thomspon, Adela Lank, NP   1 mg at 10/23/18 0854  . lisinopril (ZESTRIL) tablet 10 mg  10 mg Oral Daily Catalina Gravel, NP   10 mg at 10/23/18 0854   And  . hydrochlorothiazide (MICROZIDE) capsule 12.5 mg  12.5 mg Oral Daily Catalina Gravel, NP   12.5 mg at 10/23/18 0854  . levothyroxine (SYNTHROID) tablet 200 mcg  200 mcg  Oral QAC breakfast Aarit Kashuba, Jackquline Denmark, MD   200 mcg at 10/23/18 0630  . magnesium hydroxide (MILK OF MAGNESIA) suspension 30 mL  30 mL Oral Daily PRN Catalina Gravel, NP      . Melatonin TABS 5 mg  1 tablet Oral QHS Catalina Gravel, NP   5 mg at 10/22/18 2320  . oxybutynin (DITROPAN) tablet 5 mg  5 mg Oral BID Catalina Gravel, NP   5 mg at 10/23/18 0900  . pantoprazole (PROTONIX) EC tablet 40 mg  40 mg Oral Daily Catalina Gravel, NP   40 mg at 10/23/18 0854  . QUEtiapine (SEROQUEL) tablet 100 mg  100 mg Oral QHS Derika Eckles T, MD      . tamsulosin (FLOMAX) capsule 0.4 mg  0.4 mg Oral Daily Catalina Gravel, NP   0.4 mg at 10/23/18 0855  . traZODone (DESYREL) tablet 150 mg  150 mg Oral QHS Catalina Gravel, NP   150 mg at 10/22/18 2319  . vitamin B-12 (CYANOCOBALAMIN) tablet 1,000 mcg  1,000 mcg Oral UD Catalina Gravel, NP   1,000 mcg at 10/22/18 1610    Lab Results:  Results for orders placed or performed during the hospital encounter of 10/21/18 (from the past 48 hour(s))  TSH     Status: Abnormal   Collection Time: 10/21/18  6:13 PM  Result Value Ref Range   TSH 32.451 (H) 0.350 - 4.500 uIU/mL    Comment: Performed by a 3rd Generation assay with a functional sensitivity of <=0.01 uIU/mL. Performed at [facility], [address] [place], [place] [address]   CBC with Differential/Platelet     Status: Abnormal   Collection Time: 10/23/18  7:24 AM  Result Value Ref Range   WBC 5.3 4.0 - 10.5 K/uL   RBC 3.53 (L) 4.22 - 5.81 MIL/uL   Hemoglobin 11.5 (L) 13.0 - 17.0 g/dL   HCT 40.9 (L) 81.1 - 91.4 %   MCV 100.8 (H) 80.0 - 100.0 fL   MCH 32.6 26.0 - 34.0 pg   MCHC 32.3 30.0 - 36.0 g/dL   RDW 78.2 95.6 - 21.3 %   Platelets 78 (L) 150 - 400 K/uL    Comment: Immature Platelet Fraction may be clinically indicated, consider ordering this additional test [id number]    nRBC 0.0 0.0 - 0.2 %   Neutrophils Relative % 40 %   Neutro Abs 2.1 1.7 - 7.7  K/uL   Lymphocytes Relative 48 %   Lymphs Abs 2.5 0.7 - 4.0 K/uL   Monocytes Relative 11 %  Monocytes Absolute 0.6 0.1 - 1.0 K/uL   Eosinophils Relative 1 %   Eosinophils Absolute 0.1 0.0 - 0.5 K/uL   Basophils Relative 0 %   Basophils Absolute 0.0 0.0 - 0.1 K/uL   Immature Granulocytes 0 %   Abs Immature Granulocytes 0.02 0.00 - 0.07 K/uL    Comment: Performed at [facility], [address] [place], [place] [address]    Blood Alcohol level:  Lab Results  Component Value Date   [facility] <10 10/20/2018   ETH <10 11/23/2017    Metabolic Disorder Labs: No results found for: HGBA1C, MPG No results found for: PROLACTIN No results found for: CHOL, TRIG, HDL, CHOLHDL, VLDL, LDLCALC  Physical Findings: AIMS:  , ,  ,  ,    CIWA:    COWS:     Musculoskeletal: Strength & Muscle Tone: within normal limits Gait & Station: normal Patient leans: N/A  Psychiatric Specialty Exam: Physical Exam  Nursing note and vitals reviewed. Constitutional: He appears well-developed and well-nourished.  HENT:  Head: Normocephalic and atraumatic.  Eyes: Pupils are equal, round, and reactive to light. Conjunctivae are normal.  Neck: Normal range of motion.  Cardiovascular: Regular rhythm and normal heart sounds.  Respiratory: Effort normal.  GI: Soft.  Musculoskeletal: Normal range of motion.  Neurological: He is alert.  Skin: Skin is warm and dry.  Psychiatric: His affect is blunt. His speech is delayed. He is slowed and withdrawn. Thought content is delusional. Cognition and memory are impaired. He expresses impulsivity. He expresses no homicidal and no suicidal ideation.    Review of Systems  Constitutional: Negative.   HENT: Negative.   Eyes: Negative.   Respiratory: Negative.   Cardiovascular: Negative.   Gastrointestinal: Negative.   Musculoskeletal: Negative.   Skin: Negative.   Neurological: Negative.   Psychiatric/Behavioral: Negative.     Blood pressure 124/70, pulse  87, temperature 99.7 F (37.6 C), temperature source Oral, resp. rate 17, height  (1.753 m), weight 77.1 kg, SpO2 97 %.Body mass index is 25.1 kg/m.  General Appearance: Casual  Eye Contact:  Fair  Speech:  Garbled  Volume:  Decreased  Mood:  Euthymic  Affect:  Constricted  Thought Process:  Disorganized  Orientation:  Full (Time, Place, and Person)  Thought Content:  Rumination  Suicidal Thoughts:  No  Homicidal Thoughts:  No  Memory:  Immediate;   Fair Recent;   Poor Remote;   Poor  Judgement:  Impaired  Insight:  Shallow  Psychomotor Activity:  Decreased  Concentration:  Concentration: Poor  Recall:  Poor  Fund of Knowledge:  Poor  Language:  Poor  Akathisia:  No  Handed:  Right  AIMS (if indicated):     Assets:  Financial Resources/Insurance Housing  ADL's:  Impaired  Cognition:  Impaired,  Moderate  Sleep:  Number of Hours: 5.15     Treatment Plan Summary: Daily contact with patient to assess and evaluate symptoms and progress in treatment, Medication management and Plan I am going to restart him on the Depakote that he was taking previously.  That seemed to have been helping his mental state and the Depakote level was not excessive.  Probably not affecting his platelet count.  Continue off of the clozapine.  We will get a chance to recheck his platelets again Monday.  Put him on some Seroquel at night to help with sleep.  Monitor sleep engage in appropriate groups and activities on the unit.  Anticipate discharge for Monday  Jonny Ruiz  Danette Weinfeld, MD 10/23/2018, 12:24 PM

## 2018-10-23 NOTE — Progress Notes (Signed)
Recreation Therapy Notes  Date: 10/23/2018  Time: 9:30 am  Location: Craft room  Behavioral response: Appropriate   Intervention Topic: Team work  Discussion/Intervention:  Group content on today was focused on teamwork. The group identified what teamwork is. Individuals described who is a part of their team. Patients expressed why they thought teamwork is important. The group stated reasons why they thought it was easier to work with a Comptroller team. Individuals discussed some positives and negatives of working with a team. Patients gave examples of past experiences they had while working with a team. The group participated in the intervention "What is That", where patients were given a chance to point out qualities they look for in a team mate and were able to work in teams with each other.   Clinical Observations/Feedback:  Patient came to group and was focused on what peers and staff had to say about team work. Individual was social with peers and staff while participating in the intervention. Randy Ferguson LRT/CTRS              Earlena Werst 10/23/2018 11:11 AM

## 2018-10-23 NOTE — BHH Counselor (Signed)
CSW spoke with legal guardian, Cathlean Cower, [phone] and informed that patient will be discharged today.  [name], MSW, LCSW 10/23/2018 9:50 AM

## 2018-10-23 NOTE — Progress Notes (Signed)
Recreation Therapy Notes  INPATIENT RECREATION THERAPY ASSESSMENT  Patient Details Name: Randy Ferguson MRN: [id number] DOB: 09/14/1955 Today's Date: 10/23/2018       Information Obtained From: Chart Review  Able to Participate in Assessment/Interview:    Patient Presentation:    Reason for Admission (Per Patient): Active Symptoms  Patient Stressors:    Coping Skills:   Sports  Leisure Interests (2+):  Sports - Swimming, Sports - [employer], horse back ridding)  Frequency of Recreation/Participation: Pharmacist, community Resources:     [employer]:     Current Use:    If no, Barriers?:    Expressed Interest in [employer] Information:    [employer] of Residence:  Film/video editor  Patient Main Form of Transportation:    Patient Strengths:  N/A  Patient Identified Areas of Improvement:  N/A  Patient Goal for Hospitalization:  Taking care of business with my feeling and stuff  Current SI (including self-harm):  No  Current HI:  No  Current AVH: No  Staff Intervention Plan: Group Attendance, Collaborate with Interdisciplinary Treatment Team  Consent to Intern Participation: N/A  Randy Ferguson 10/23/2018, 1:43 PM
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
## 2018-10-23 NOTE — BHH Counselor (Signed)
CSW spoke with group home owner regarding the patient being discharged today and inquired if patient can return to the home.  Randy Ferguson had questions on the medications prescribed and any changes.  CSW encouraged Randy Ferguson to speak with the nurse or Dr. Toni Amend.  CSW asked that he follow up once he has spoken with the nurse so that time and transportation back to the group home could be figured out.   [name], MSW, LCSW 10/23/2018 9:47 AM

## 2018-10-23 NOTE — Plan of Care (Signed)
Patient stated to this writer he is ready to go to a group home and get out of here.   Problem: Education: Goal: Emotional status will improve 10/23/2018 0134 by Elmyra Ricks, RN Outcome: Progressing  Goal: Mental status will improve 10/23/2018 0134 by Elmyra Ricks, RN Outcome: Progressing
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
## 2018-10-23 NOTE — Progress Notes (Signed)
Patient is not feeling well, so MD instructed this writer to hold patient's afternoon medication.

## 2018-10-23 NOTE — [facility] Suicide Risk Assessment (Signed)
[facility] Discharge Suicide Risk Assessment   Principal Problem: Schizophrenia, chronic condition (HCC) Discharge Diagnoses: Principal Problem:   Schizophrenia, chronic condition (HCC) Active Problems:   Essential hypertension   Hypothyroidism   Tardive dyskinesia   Thrombocytopenia (HCC)   Prostate hypertrophy   Total Time spent with patient: 45 minutes  Musculoskeletal: Strength & Muscle Tone: within normal limits Gait & Station: normal Patient leans: N/A  Psychiatric Specialty Exam: Review of Systems  Constitutional: Negative.   HENT: Negative.   Eyes: Negative.   Respiratory: Negative.   Cardiovascular: Negative.   Gastrointestinal: Negative.   Musculoskeletal: Negative.   Skin: Negative.   Neurological: Negative.   Psychiatric/Behavioral: Negative for depression, hallucinations, memory loss, substance abuse and suicidal ideas. The patient is not nervous/anxious and does not have insomnia.     Blood pressure 124/70, pulse 87, temperature 99.7 F (37.6 C), temperature source Oral, resp. rate 17, height 5\' 9"  (1.753 m), weight 77.1 kg, SpO2 97 %.Body mass index is 25.1 kg/m.  General Appearance: Casual  Eye Contact::  Fair  Speech:  [phone]  Volume:  Decreased  Mood:  Euthymic  Affect:  Congruent  Thought Process:  Disorganized  Orientation:  Negative  Thought Content:  Illogical, Rumination and Tangential  Suicidal Thoughts:  No  Homicidal Thoughts:  No  Memory:  Immediate;   Poor Recent;   Poor Remote;   Poor  Judgement:  Impaired  Insight:  Shallow  Psychomotor Activity:  Normal  Concentration:  Poor  Recall:  Poor  Fund of Knowledge:Poor  Language: Poor  Akathisia:  No  Handed:  Right  AIMS (if indicated):     Assets:  Housing  Sleep:  Number of Hours: 5.15  Cognition: Impaired,  Moderate and Severe  ADL's:  Impaired   Mental Status Per Nursing Assessment::   On Admission:  NA  Demographic Factors:  Male  Loss Factors: Decline in physical  health  Historical Factors: Impulsivity  Risk Reduction Factors:   Living with another person, especially a relative, Positive social support and Positive therapeutic relationship  Continued Clinical Symptoms:  Schizophrenia:   Paranoid or undifferentiated type  Cognitive Features That Contribute To Risk:  Loss of executive function    Suicide Risk:  Minimal: No identifiable suicidal ideation.  Patients presenting with no risk factors but with morbid ruminations; may be classified as minimal risk based on the severity of the depressive symptoms    Plan Of Care/Follow-up recommendations:  Activity:  Activity as tolerated Diet:  Regular diet Other:  Follow-up with outpatient provider  Mordecai Rasmussen, MD 10/23/2018, 10:59 AM

## 2018-10-23 NOTE — [facility] Counselor (Signed)
CSW spoke with the patient's guardian to inform that the patient would not be discharged today as previously reported and instead will likely be discharged on Monday.  CSW also called the group home owner to make them aware as well.  [name], MSW, LCSW 10/23/2018 1:09 PM

## 2018-10-23 NOTE — [facility] Group Notes (Signed)
LCSW Group Therapy Note  10/23/2018 1:53 PM  Type of Therapy and Topic:  Group Therapy:  Feelings around Relapse and Recovery  Participation Level:  Minimal   Description of Group:    Patients in this group will discuss emotions they experience before and after a relapse. They will process how experiencing these feelings, or avoidance of experiencing them, relates to having a relapse. Facilitator will guide patients to explore emotions they have related to recovery. Patients will be encouraged to process which emotions are more powerful. They will be guided to discuss the emotional reaction significant others in their lives may have to their relapse or recovery. Patients will be assisted in exploring ways to respond to the emotions of others without this contributing to a relapse.  Therapeutic Goals: 1. Patient will identify two or more emotions that lead to a relapse for them 2. Patient will identify two emotions that result when they relapse 3. Patient will identify two emotions related to recovery 4. Patient will demonstrate ability to communicate their needs through discussion and/or role plays   Summary of Patient Progress: Pt came to group, but was sleeping for most of the group. Pt woke up and reported that he felt sick and was then escorted out by a nurse.    Therapeutic Modalities:   Cognitive Behavioral Therapy Solution-Focused Therapy Assertiveness Training Relapse Prevention Therapy   Iris Pert, MSW, LCSW Clinical Social Work 10/23/2018 1:53 PM

## 2018-10-23 NOTE — Plan of Care (Signed)
D- Patient alert and oriented. Patient presents in a pleasant mood on assessment asking this Clinical research associate about his clothes. This Clinical research associate had a hard time understanding patient. Patient didn't endorse any signs/symptoms of depression/anxiety, however, he did ask this Clinical research associate if I could give him a loan because he does not want to go back to his group home. Patient can be logical/coherent at times, and then his speech becomes slurred and incoherent. Patient denies SI, HI, AVH, and pain at this time. Patient has no stated goals for today.  A- Scheduled medications administered to patient, per MD orders. Support and encouragement provided.  Routine safety checks conducted every 15 minutes.  Patient informed to notify staff with problems or concerns.  R- No adverse drug reactions noted. Patient contracts for safety at this time. Patient compliant with medications and treatment plan. Patient receptive, calm, and cooperative. Patient interacts well with others on the unit.  Patient remains safe at this time.  Problem: Education: Goal: Emotional status will improve Outcome: Progressing   Problem: Coping: Goal: Ability to verbalize frustrations and anger appropriately will improve Outcome: Progressing Goal: Ability to demonstrate self-control will improve Outcome: Progressing   Problem: Safety: Goal: Periods of time without injury will increase Outcome: Progressing   Problem: Self-Concept: Goal: Will verbalize positive feelings about self Outcome: Adequate for Discharge

## 2018-10-23 NOTE — Tx Team (Addendum)
Interdisciplinary Treatment and Diagnostic Plan Update  10/23/2018 Time of Session: 2:30PM Randy Ferguson MRN: [id number]  Principal Diagnosis: Schizophrenia, chronic condition (HCC)  Secondary Diagnoses: Principal Problem:   Schizophrenia, chronic condition (HCC) Active Problems:   Essential hypertension   Hypothyroidism   Tardive dyskinesia   Thrombocytopenia (HCC)   Prostate hypertrophy   Current Medications:  Current Facility-Administered Medications  Medication Dose Route Frequency Provider Last Rate Last Dose  . acetaminophen (TYLENOL) tablet 650 mg  650 mg Oral Q6H PRN Catalina Gravel, NP      . alum & mag hydroxide-simeth (MAALOX/MYLANTA) 200-200-20 MG/5ML suspension 30 mL  30 mL Oral Q4H PRN Thomspon, Adela Lank, NP      . clonazePAM (KLONOPIN) tablet 1 mg  1 mg Oral TID Clapacs, Jackquline Denmark, MD   Stopped at 10/23/18 1333  . divalproex (DEPAKOTE) DR tablet 500 mg  500 mg Oral TID Clapacs, Jackquline Denmark, MD   Stopped at 10/23/18 1334  . docusate sodium (COLACE) capsule 100 mg  100 mg Oral BID Catalina Gravel, NP   100 mg at 10/23/18 0854  . finasteride (PROSCAR) tablet 5 mg  5 mg Oral Daily Catalina Gravel, NP   5 mg at 10/23/18 0855  . folic acid (FOLVITE) tablet 1 mg  1 mg Oral Daily Thomspon, Adela Lank, NP   1 mg at 10/23/18 0854  . lisinopril (ZESTRIL) tablet 10 mg  10 mg Oral Daily Catalina Gravel, NP   10 mg at 10/23/18 0854   And  . hydrochlorothiazide (MICROZIDE) capsule 12.5 mg  12.5 mg Oral Daily Catalina Gravel, NP   12.5 mg at 10/23/18 0854  . levothyroxine (SYNTHROID) tablet 200 mcg  200 mcg Oral QAC breakfast Clapacs, Jackquline Denmark, MD   200 mcg at 10/23/18 0630  . magnesium hydroxide (MILK OF MAGNESIA) suspension 30 mL  30 mL Oral Daily PRN Catalina Gravel, NP      . Melatonin TABS 5 mg  1 tablet Oral QHS Catalina Gravel, NP   5 mg at 10/22/18 2320  . oxybutynin (DITROPAN) tablet 5 mg  5 mg Oral BID Catalina Gravel, NP   5 mg at 10/23/18  0900  . pantoprazole (PROTONIX) EC tablet 40 mg  40 mg Oral Daily Catalina Gravel, NP   40 mg at 10/23/18 0854  . QUEtiapine (SEROQUEL) tablet 100 mg  100 mg Oral QHS Clapacs, John T, MD      . tamsulosin (FLOMAX) capsule 0.4 mg  0.4 mg Oral Daily Catalina Gravel, NP   0.4 mg at 10/23/18 0855  . traZODone (DESYREL) tablet 150 mg  150 mg Oral QHS Catalina Gravel, NP   150 mg at 10/22/18 2319  . vitamin B-12 (CYANOCOBALAMIN) tablet 1,000 mcg  1,000 mcg Oral UD Catalina Gravel, NP   1,000 mcg at 10/22/18 4098   PTA Medications: Medications Prior to Admission  Medication Sig Dispense Refill Last Dose  . ARIPiprazole ER (ABILIFY MAINTENA) 400 MG SRER injection Inject 400 mg into the muscle every 28 (twenty-eight) days.   Past Month at Unknown time  . benztropine (COGENTIN) 2 MG tablet Take 2 mg by mouth 2 (two) times daily.   10/20/2018 at 2000  . clonazePAM (KLONOPIN) 0.5 MG tablet Take 0.5 mg by mouth daily as needed for anxiety.   prn at prn  . clonazePAM (KLONOPIN) 1 MG tablet Take 1 mg by mouth 2 (two) times daily.   10/20/2018 at 2000  . cloZAPine (CLOZARIL) 100 MG tablet Take 100-300 mg by mouth daily. Take 2  tablets in the am and 3 tablets qhs   10/20/2018 at 2000  . divalproex (DEPAKOTE) 500 MG DR tablet Take 500 mg by mouth 3 (three) times daily.   10/20/2018 at 2000  . docusate sodium (COLACE) 100 MG capsule Take 100 mg by mouth 2 (two) times daily.   10/20/2018 at 0800  . finasteride (PROSCAR) 5 MG tablet Take 5 mg by mouth daily.   10/20/2018 at 0800  . folic acid (FOLVITE) 1 MG tablet Take 1 mg by mouth daily.   10/20/2018 at 0800  . levothyroxine (SYNTHROID) 137 MCG tablet Take 137 mcg by mouth daily before breakfast.   10/20/2018 at 0800  . Melatonin 5 MG TABS Take 1 tablet by mouth at bedtime.   10/20/2018 at 2000  . omeprazole (PRILOSEC) 20 MG capsule Take 20 mg by mouth daily.   10/20/2018 at 0800  . oxybutynin (DITROPAN) 5 MG tablet Take 5 mg by mouth 2 (two) times  daily.   10/20/2018 at 2000  . tamsulosin (FLOMAX) 0.4 MG CAPS capsule Take 0.4 mg by mouth daily.   10/20/2018 at 0800  . traZODone (DESYREL) 150 MG tablet Take 150 mg by mouth at bedtime.   10/20/2018 at 2000  . vitamin B-12 (CYANOCOBALAMIN) 1000 MCG tablet Take 1,000 mcg by mouth as directed. Take one tablet on Monday, weds, fri and sat   Past Week at 0800  . [DISCONTINUED] lisinopril-hydrochlorothiazide (ZESTORETIC) 10-12.5 MG tablet Take 1 tablet by mouth daily.   10/20/2018 at 0800    Patient Stressors: Medication change or noncompliance Other: living situation  Patient Strengths: Motivation for treatment/growth Supportive family/friends  Treatment Modalities: Medication Management, Group therapy, Case management,  1 to 1 session with clinician, Psychoeducation, Recreational therapy.   Physician Treatment Plan for Primary Diagnosis: Schizophrenia, chronic condition (HCC) Long Term Goal(s): Improvement in symptoms so as ready for discharge Improvement in symptoms so as ready for discharge   Short Term Goals: Ability to demonstrate self-control will improve Compliance with prescribed medications will improve  Medication Management: Evaluate patient's response, side effects, and tolerance of medication regimen.  Therapeutic Interventions: 1 to 1 sessions, Unit Group sessions and Medication administration.  Evaluation of Outcomes: Not Progressing  Physician Treatment Plan for Secondary Diagnosis: Principal Problem:   Schizophrenia, chronic condition (HCC) Active Problems:   Essential hypertension   Hypothyroidism   Tardive dyskinesia   Thrombocytopenia (HCC)   Prostate hypertrophy  Long Term Goal(s): Improvement in symptoms so as ready for discharge Improvement in symptoms so as ready for discharge   Short Term Goals: Ability to demonstrate self-control will improve Compliance with prescribed medications will improve     Medication Management: Evaluate patient's response,  side effects, and tolerance of medication regimen.  Therapeutic Interventions: 1 to 1 sessions, Unit Group sessions and Medication administration.  Evaluation of Outcomes: Not Progressing   RN Treatment Plan for Primary Diagnosis: Schizophrenia, chronic condition (HCC) Long Term Goal(s): Knowledge of disease and therapeutic regimen to maintain health will improve  Short Term Goals: Ability to demonstrate self-control, Ability to participate in decision making will improve, Ability to verbalize feelings will improve and Ability to disclose and discuss suicidal ideas  Medication Management: RN will administer medications as ordered by provider, will assess and evaluate patient's response and provide education to patient for prescribed medication. RN will report any adverse and/or side effects to prescribing provider.  Therapeutic Interventions: 1 on 1 counseling sessions, Psychoeducation, Medication administration, Evaluate responses to treatment, Monitor vital signs and CBGs as  ordered, Perform/monitor CIWA, COWS, AIMS and Fall Risk screenings as ordered, Perform wound care treatments as ordered.  Evaluation of Outcomes: Not Progressing   LCSW Treatment Plan for Primary Diagnosis: Schizophrenia, chronic condition (HCC) Long Term Goal(s): Safe transition to appropriate next level of care at discharge, Engage patient in therapeutic group addressing interpersonal concerns.  Short Term Goals: Engage patient in aftercare planning with referrals and resources, Increase social support, Increase ability to appropriately verbalize feelings and Increase emotional regulation  Therapeutic Interventions: Assess for all discharge needs, 1 to 1 time with Social worker, Explore available resources and support systems, Assess for adequacy in community support network, Educate family and significant other(s) on suicide prevention, Complete Psychosocial Assessment, Interpersonal group therapy.  Evaluation of  Outcomes: Not Progressing   Progress in Treatment: Attending groups: Yes. Participating in groups: Yes. Taking medication as prescribed: Yes. Toleration medication: Yes. Family/Significant other contact made: Yes, individual(s) contacted:  SPE completed with legal guardian Patient understands diagnosis: Yes. Discussing patient identified problems/goals with staff: Yes. Medical problems stabilized or resolved: Yes. Denies suicidal/homicidal ideation: No. Issues/concerns per patient self-inventory: No. Other: none  New problem(s) identified: No, Describe:  none  New Short Term/Long Term Goal(s): elimination of symptoms of psychosis, medication management for mood stabilization; elimination of SI thoughts; development of comprehensive mental wellness plan.  Patient Goals:  "get back where I came from"  Discharge Plan or Barriers: Pt is expected to discharge on 10/26/2018.  Upon discharge the plan is for the patient to return to his group home [facility].   Reason for Continuation of Hospitalization: Aggression Suicidal ideation  Estimated Length of Stay: 1-5 days  Recreational Therapy: Patient Stressors: N/A Patient Goal: Patient will focus on task/topic with 2 prompts from staff within 5 recreation therapy group sessions  Attendees: Patient: Randy Ferguson 10/23/2018 3:05 PM  Physician: Dr. Toni Amend, MD 10/23/2018 3:05 PM  Nursing: Doyce Para, RN 10/23/2018 3:05 PM  RN Care Manager: 10/23/2018 3:05 PM  Social Worker: [name], MSW, LCSW 10/23/2018 3:05 PM  Recreational Therapist: Hilbert Bible, LRT 10/23/2018 3:05 PM  Other: Lowella Dandy, LCSW 10/23/2018 3:05 PM  Other: Iris Pert, LCSW 10/23/2018 3:05 PM  Other: 10/23/2018 3:05 PM    Scribe for Treatment Team: Harden Mo, LCSW 10/23/2018 3:05 PM

## 2018-10-24 LAB — CLOZAPINE (CLOZARIL)
Clozapine Lvl: 404 ng/mL (ref 350–650)
NorClozapine: 158 ng/mL
Total(Cloz+Norcloz): 562 ng/mL

## 2018-10-24 NOTE — Progress Notes (Signed)
D: Patient stated slept good last night .Stated appetite is good and energy level  Is normal. Stated concentration is good . Stated on Depression scale , hopeless and anxiety .( low 0-10 high) Denies suicidal  homicidal ideations  . Responding  To internal  Stimuli. No pain concerns .No  ADL'S. Interacting with peers and staff. Un able to verbalize understanding of information received.  Staff continue to revisit  information for better understanding Mental and emotional status improved. Engaging in activities on the unit . Voice of no safety concerns .  No anger outburst or self control issues . Continue to show dystonic  Symptoms  With lip smacking  Compliant with medication  A: Encourage patient participation with unit programming . Instruction  Given on  Medication , verbalize understanding.  R: Voice no other concerns. Staff continue to monitor

## 2018-10-24 NOTE — Progress Notes (Signed)
[facility] MD Progress Note  10/24/2018 1:00 PM Randy Ferguson  MRN:  [id number] Subjective: Patient seen chart reviewed.    Randy Ferguson is seen quietly sitting in the day room.  Randy Ferguson said that everything is going ok, and Randy Ferguson has no complaints. Tolerates his meds.    RN: no behavioral issues.    Principal Problem: Schizophrenia, chronic condition (HCC) Diagnosis: Principal Problem:   Schizophrenia, chronic condition (HCC) Active Problems:   Essential hypertension   Hypothyroidism   Tardive dyskinesia   Thrombocytopenia (HCC)   Prostate hypertrophy  Total Time spent with patient: 15 minutes  Past Psychiatric History: Patient has a long history of chronic mental illness and intellectual impairment  Past Medical History:  Past Medical History:  Diagnosis Date  . GERD (gastroesophageal reflux disease)   . Hypertension   . Schizophrenia (HCC)   . Thrombocytopenia (HCC)   . Thyroid disease    History reviewed. No pertinent surgical history. Family History: History reviewed. No pertinent family history. Family Psychiatric  History: See previous Social History:  Social History   Substance and Sexual Activity  Alcohol Use Not Currently     Social History   Substance and Sexual Activity  Drug Use Not on file    Social History   Socioeconomic History  . Marital status: Unknown    Spouse name: Not on file  . Number of children: Not on file  . Years of education: Not on file  . Highest education level: Not on file  Occupational History  . Not on file  Social Needs  . Financial resource strain: Not on file  . Food insecurity:    Worry: Not on file    Inability: Not on file  . Transportation needs:    Medical: Not on file    Non-medical: Not on file  Tobacco Use  . Smoking status: Current Every Day Smoker    Packs/day: 0.50    Types: Cigarettes  . Smokeless tobacco: Never Used  Substance and Sexual Activity  . Alcohol use: Not Currently  . Drug use: Not on file  . Sexual  activity: Not on file  Lifestyle  . Physical activity:    Days per week: Not on file    Minutes per session: Not on file  . Stress: Not on file  Relationships  . Social connections:    Talks on phone: Not on file    Gets together: Not on file    Attends religious service: Not on file    Active member of club or organization: Not on file    Attends meetings of clubs or organizations: Not on file    Relationship status: Not on file  Other Topics Concern  . Not on file  Social History Narrative  . Not on file   Additional Social History:   Sleep: Fair  Appetite:  Fair  Current Medications: Current Facility-Administered Medications  Medication Dose Route Frequency Provider Last Rate Last Dose  . acetaminophen (TYLENOL) tablet 650 mg  650 mg Oral Q6H PRN Catalina Gravel, NP      . alum & mag hydroxide-simeth (MAALOX/MYLANTA) 200-200-20 MG/5ML suspension 30 mL  30 mL Oral Q4H PRN Thomspon, Adela Lank, NP      . clonazePAM (KLONOPIN) tablet 1 mg  1 mg Oral TID Clapacs, Jackquline Denmark, MD   1 mg at 10/24/18 1137  . divalproex (DEPAKOTE) DR tablet 500 mg  500 mg Oral TID Clapacs, Jackquline Denmark, MD   500 mg at 10/24/18 1137  . docusate  sodium (COLACE) capsule 100 mg  100 mg Oral BID Catalina Gravelhomspon, Jacqueline, NP   100 mg at 10/24/18 0803  . finasteride (PROSCAR) tablet 5 mg  5 mg Oral Daily Catalina Gravelhomspon, Jacqueline, NP   5 mg at 10/24/18 0804  . folic acid (FOLVITE) tablet 1 mg  1 mg Oral Daily Thomspon, Adela LankJacqueline, NP   1 mg at 10/24/18 0803  . lisinopril (ZESTRIL) tablet 10 mg  10 mg Oral Daily Catalina Gravelhomspon, Jacqueline, NP   10 mg at 10/23/18 [id number]   And  . hydrochlorothiazide (MICROZIDE) capsule 12.5 mg  12.5 mg Oral Daily Catalina Gravelhomspon, Jacqueline, NP   12.5 mg at 10/24/18 0802  . levothyroxine (SYNTHROID) tablet 200 mcg  200 mcg Oral QAC breakfast Clapacs, Jackquline DenmarkJohn T, MD   200 mcg at 10/24/18 0641  . magnesium hydroxide (MILK OF MAGNESIA) suspension 30 mL  30 mL Oral Daily PRN Catalina Gravelhomspon, Jacqueline, NP      .  Melatonin TABS 5 mg  1 tablet Oral QHS Catalina Gravelhomspon, Jacqueline, NP   5 mg at 10/23/18 2146  . oxybutynin (DITROPAN) tablet 5 mg  5 mg Oral BID Catalina Gravelhomspon, Jacqueline, NP   5 mg at 10/24/18 0803  . pantoprazole (PROTONIX) EC tablet 40 mg  40 mg Oral Daily Catalina Gravelhomspon, Jacqueline, NP   40 mg at 10/24/18 0803  . QUEtiapine (SEROQUEL) tablet 100 mg  100 mg Oral QHS Clapacs, John T, MD   100 mg at 10/23/18 2145  . tamsulosin (FLOMAX) capsule 0.4 mg  0.4 mg Oral Daily Catalina Gravelhomspon, Jacqueline, NP   0.4 mg at 10/24/18 0804  . traZODone (DESYREL) tablet 150 mg  150 mg Oral QHS Catalina Gravelhomspon, Jacqueline, NP   150 mg at 10/23/18 2145  . vitamin B-12 (CYANOCOBALAMIN) tablet 1,000 mcg  1,000 mcg Oral UD Catalina Gravelhomspon, Jacqueline, NP   1,000 mcg at 10/22/18 [id number]    Lab Results:  Results for orders placed or performed during the hospital encounter of 10/21/18 (from the past 48 hour(s))  CBC with Differential/Platelet     Status: Abnormal   Collection Time: 10/23/18  7:24 AM  Result Value Ref Range   WBC 5.3 4.0 - 10.5 K/uL   RBC 3.53 (L) 4.22 - 5.81 MIL/uL   Hemoglobin 11.5 (L) 13.0 - 17.0 g/dL   HCT 44.035.6 (L) 10.239.0 - 72.552.0 %   MCV 100.8 (H) 80.0 - 100.0 fL   MCH 32.6 26.0 - 34.0 pg   MCHC 32.3 30.0 - 36.0 g/dL   RDW 36.614.7 44.011.5 - 34.715.5 %   Platelets 78 (L) 150 - 400 K/uL    Comment: Immature Platelet Fraction may be clinically indicated, consider ordering this additional test [id number]    nRBC 0.0 0.0 - 0.2 %   Neutrophils Relative % 40 %   Neutro Abs 2.1 1.7 - 7.7 K/uL   Lymphocytes Relative 48 %   Lymphs Abs 2.5 0.7 - 4.0 K/uL   Monocytes Relative 11 %   Monocytes Absolute 0.6 0.1 - 1.0 K/uL   Eosinophils Relative 1 %   Eosinophils Absolute 0.1 0.0 - 0.5 K/uL   Basophils Relative 0 %   Basophils Absolute 0.0 0.0 - 0.1 K/uL   Immature Granulocytes 0 %   Abs Immature Granulocytes 0.02 0.00 - 0.07 K/uL    Comment: Performed at [facility], [address] [place], [place] [id number]    Blood Alcohol  level:  Lab Results  Component Value Date   ETH <10 10/20/2018   ETH <10 11/23/2017  Metabolic Disorder Labs: No results found for: HGBA1C, MPG No results found for: PROLACTIN No results found for: CHOL, TRIG, HDL, CHOLHDL, VLDL, LDLCALC  Physical Findings: AIMS:  , ,  ,  ,    CIWA:    COWS:     Musculoskeletal: Strength & Muscle Tone: within normal limits Gait & Station: normal Patient leans: N/A  Psychiatric Specialty Exam: Physical Exam  Nursing note and vitals reviewed. Constitutional: Randy Ferguson appears well-developed and well-nourished.  HENT:  Head: Normocephalic and atraumatic.  Eyes: Pupils are equal, round, and reactive to light. Conjunctivae are normal.  Neck: Normal range of motion.  Cardiovascular: Regular rhythm and normal heart sounds.  Respiratory: Effort normal.  GI: Soft.  Musculoskeletal: Normal range of motion.  Neurological: Randy Ferguson is alert.  Skin: Skin is warm and dry.  Psychiatric: His affect is blunt. His speech is delayed. Randy Ferguson is slowed and withdrawn. Thought content is delusional. Cognition and memory are impaired. Randy Ferguson expresses impulsivity. Randy Ferguson expresses no homicidal and no suicidal ideation.    Review of Systems  Constitutional: Negative.   HENT: Negative.   Eyes: Negative.   Respiratory: Negative.   Cardiovascular: Negative.   Gastrointestinal: Negative.   Musculoskeletal: Negative.   Skin: Negative.   Neurological: Negative.   Psychiatric/Behavioral: Negative.     Blood pressure 93/70, pulse 72, temperature 98.8 F (37.1 C), temperature source Oral, resp. rate 16, height 5\' 9"  (1.753 m), weight 77.1 kg, SpO2 99 %.Body mass index is 25.1 kg/m.  General Appearance: Casual  Eye Contact:  Fair  Speech:  Garbled  Volume:  Decreased  Mood:  Euthymic  Affect:  Constricted  Thought Process:  Disorganized  Orientation:  Full (Time, Place, and Person)  Thought Content:  Rumination  Suicidal Thoughts:  No  Homicidal Thoughts:  No  Memory:   Immediate;   Fair Recent;   Poor Remote;   Poor  Judgement:  Impaired  Insight:  Shallow  Psychomotor Activity:  Decreased  Concentration:  Concentration: Poor  Recall:  Poor  Fund of Knowledge:  Poor  Language:  Poor  Akathisia:  No  Handed:  Right  AIMS (if indicated):     Assets:  Financial Resources/Insurance Housing  ADL's:  Impaired  Cognition:  Impaired,  Moderate  Sleep:  Number of Hours: 3.75     Treatment Plan Summary: Daily contact with patient to assess and evaluate symptoms and progress in treatment and Medication management   # Schizoaffective disorder -- continue Depakote 500mg  TID -- continue seroquel 100mg  qhs  -- continue Trazodone 100mg  qhs -- continue Klonopin 1mg  TID, however, consider taper the dose, as long-term use rarely effective, yet high risks for side effects and physical dependence.   # Thrombocytopenia -- recheck on Monday, can cause by psychotic meds such as depakote.   -- UDS is still pending.   # Disposition: -- defer to primary team.    Randy Dubeau, MD 10/24/2018, 1:00 PM

## 2018-10-24 NOTE — Plan of Care (Signed)
Patient is redirectable but still intrusive with staff.   Problem: Education: Goal: Emotional status will improve Outcome: Not Progressing

## 2018-10-24 NOTE — [facility] Group Notes (Signed)
LCSW Group Therapy Note  10/24/2018 1:15pm  Type of Therapy and Topic:  Group Therapy:  Cognitive Distortions  Participation Level:  None   Description of Group:    Patients in this group will be introduced to the topic of cognitive distortions.  Patients will identify and describe cognitive distortions, describe the feelings these distortions create for them.  Patients will identify one or more situations in their personal life where they have cognitively distorted thinking and will verbalize challenging this cognitive distortion through positive thinking skills.  Patients will practice the skill of using positive affirmations to challenge cognitive distortions using affirmation cards.    Therapeutic Goals:  1. Patient will identify two or more cognitive distortions they have used 2. Patient will identify one or more emotions that stem from use of a cognitive distortion 3. Patient will demonstrate use of a positive affirmation to counter a cognitive distortion through discussion and/or role play. 4. Patient will describe one way cognitive distortions can be detrimental to wellness   Summary of Patient Progress: The patient attended group but did not participate.      Therapeutic Modalities:   Cognitive Behavioral Therapy Motivational Interviewing   Sarahi Borland  CUEBAS-COLON, LCSW 10/24/2018 10:18 AM

## 2018-10-24 NOTE — Progress Notes (Signed)
D - Patient was in his room upon arrival to the unit. Patient was pleasant during assessment and medication administration. Patient denies SI/HI/AVH, pain, anxiety and depression with this Clinical research associate. Patient presents with a childlike affect. Patient stated to this writer that he wants to get out of here soon.   A - Patient was compliant with medications per MD orders. Patient given education. Patient given support and encouragement to be active in his treatment plan. Patient informed to let staff know if there are any issues or problems on the unit.   R - Patient being monitored Q 15 minutes for safety per unit protocol. Patient remains safe on the unit.

## 2018-10-24 NOTE — Plan of Care (Signed)
Un able to verbalize understanding of information received.  Staff continue to revisit  information for better understanding Mental and emotional status improved. Engaging in activities on the unit . Voice of no safety concerns .  No anger outburst or self control issues .  Problem: Education: Goal: Emotional status will improve Outcome: Progressing   Problem: Coping: Goal: Ability to verbalize frustrations and anger appropriately will improve Outcome: Progressing Goal: Ability to demonstrate self-control will improve Outcome: Progressing   Problem: Safety: Goal: Periods of time without injury will increase Outcome: Progressing

## 2018-10-24 NOTE — Progress Notes (Signed)
Dr. Katrinka Blazing notified of patient's platlets count 78

## 2018-10-25 NOTE — Plan of Care (Signed)
  Problem: Coping: Goal: Ability to verbalize frustrations and anger appropriately will improve Outcome: Progressing Goal: Ability to demonstrate self-control will improve Outcome: Progressing   D: Patient has been cooperative. Continues to deny SI and HI. Has been up a couple of times during the night but has been appropriate. Confused at times, not remembering where his room is or where the bathroom is.  A: Continue to monitor for safety. R: Safety maintained.

## 2018-10-25 NOTE — Progress Notes (Signed)
[facility] MD Progress Note  10/25/2018 1:14 PM Randy Ferguson  MRN:  [id number] Subjective: Patient seen chart reviewed.    Kaziah Krizek reports feeling "not good", and attempted to elaborate. Yet, it was hard to understand his speech, and his thought process seems to be incoherent.  Venissa Nappi mentioned something about his anger in Group home, but the reasoning was difficult to understand. Kiril Hippe described the [facility] as a "strange place" then said "I have daughter".  The writer could not make sense of what Waynette Towers was trying to say.   No SI or HI.     Principal Problem: Schizophrenia, chronic condition (HCC) Diagnosis: Principal Problem:   Schizophrenia, chronic condition (HCC) Active Problems:   Essential hypertension   Hypothyroidism   Tardive dyskinesia   Thrombocytopenia (HCC)   Prostate hypertrophy  Total Time spent with patient: 15 minutes  Past Psychiatric History: Patient has a long history of chronic mental illness and intellectual impairment  Past Medical History:  Past Medical History:  Diagnosis Date  . GERD (gastroesophageal reflux disease)   . Hypertension   . Schizophrenia (HCC)   . Thrombocytopenia (HCC)   . Thyroid disease    History reviewed. No pertinent surgical history. Family History: History reviewed. No pertinent family history. Family Psychiatric  History: See previous Social History:  Social History   Substance and Sexual Activity  Alcohol Use Not Currently     Social History   Substance and Sexual Activity  Drug Use Not on file    Social History   Socioeconomic History  . Marital status: Unknown    Spouse name: Not on file  . Number of children: Not on file  . Years of education: Not on file  . Highest education level: Not on file  Occupational History  . Not on file  Social Needs  . Financial resource strain: Not on file  . Food insecurity:    Worry: Not on file    Inability: Not on file  . Transportation needs:    Medical: Not on file    Non-medical: Not on  file  Tobacco Use  . Smoking status: Current Every Day Smoker    Packs/day: 0.50    Types: Cigarettes  . Smokeless tobacco: Never Used  Substance and Sexual Activity  . Alcohol use: Not Currently  . Drug use: Not on file  . Sexual activity: Not on file  Lifestyle  . Physical activity:    Days per week: Not on file    Minutes per session: Not on file  . Stress: Not on file  Relationships  . Social connections:    Talks on phone: Not on file    Gets together: Not on file    Attends religious service: Not on file    Active member of club or organization: Not on file    Attends meetings of clubs or organizations: Not on file    Relationship status: Not on file  Other Topics Concern  . Not on file  Social History Narrative  . Not on file   Additional Social History:   Sleep: Fair  Appetite:  Fair  Current Medications: Current Facility-Administered Medications  Medication Dose Route Frequency Provider Last Rate Last Dose  . acetaminophen (TYLENOL) tablet 650 mg  650 mg Oral Q6H PRN Catalina Gravel, NP      . alum & mag hydroxide-simeth (MAALOX/MYLANTA) 200-200-20 MG/5ML suspension 30 mL  30 mL Oral Q4H PRN Catalina Gravel, NP      . clonazePAM (KLONOPIN) tablet 1  mg  1 mg Oral TID Clapacs, Jackquline Denmark, MD   1 mg at 10/25/18 1154  . divalproex (DEPAKOTE) DR tablet 500 mg  500 mg Oral TID Clapacs, Jackquline Denmark, MD   500 mg at 10/25/18 1154  . docusate sodium (COLACE) capsule 100 mg  100 mg Oral BID Catalina Gravel, NP   100 mg at 10/25/18 0900  . finasteride (PROSCAR) tablet 5 mg  5 mg Oral Daily Thomspon, Adela Lank, NP   5 mg at 10/25/18 0900  . folic acid (FOLVITE) tablet 1 mg  1 mg Oral Daily Thomspon, Adela Lank, NP   1 mg at 10/25/18 0900  . lisinopril (ZESTRIL) tablet 10 mg  10 mg Oral Daily Catalina Gravel, NP   10 mg at 10/25/18 0900   And  . hydrochlorothiazide (MICROZIDE) capsule 12.5 mg  12.5 mg Oral Daily Catalina Gravel, NP   12.5 mg at 10/25/18 0900   . levothyroxine (SYNTHROID) tablet 200 mcg  200 mcg Oral QAC breakfast Clapacs, Jackquline Denmark, MD   200 mcg at 10/25/18 [phone]  . magnesium hydroxide (MILK OF MAGNESIA) suspension 30 mL  30 mL Oral Daily PRN Catalina Gravel, NP      . Melatonin TABS 5 mg  1 tablet Oral QHS Catalina Gravel, NP   5 mg at 10/24/18 2130  . oxybutynin (DITROPAN) tablet 5 mg  5 mg Oral BID Catalina Gravel, NP   5 mg at 10/25/18 0900  . pantoprazole (PROTONIX) EC tablet 40 mg  40 mg Oral Daily Thomspon, Adela Lank, NP   40 mg at 10/25/18 0900  . QUEtiapine (SEROQUEL) tablet 100 mg  100 mg Oral QHS Clapacs, John T, MD   100 mg at 10/24/18 2130  . tamsulosin (FLOMAX) capsule 0.4 mg  0.4 mg Oral Daily Thomspon, Adela Lank, NP   0.4 mg at 10/25/18 0900  . traZODone (DESYREL) tablet 150 mg  150 mg Oral QHS Catalina Gravel, NP   150 mg at 10/24/18 2129  . vitamin B-12 (CYANOCOBALAMIN) tablet 1,000 mcg  1,000 mcg Oral UD Catalina Gravel, NP   1,000 mcg at 10/24/18 1716    Lab Results:  No results found for this or any previous visit (from the past 48 hour(s)).  Blood Alcohol level:  Lab Results  Component Value Date   ETH <10 10/20/2018   ETH <10 11/23/2017    Metabolic Disorder Labs: No results found for: HGBA1C, MPG No results found for: PROLACTIN No results found for: CHOL, TRIG, HDL, CHOLHDL, VLDL, LDLCALC  Physical Findings: AIMS:  , ,  ,  ,    CIWA:    COWS:     Musculoskeletal: Strength & Muscle Tone: within normal limits Gait & Station: normal Patient leans: N/A  Psychiatric Specialty Exam: Physical Exam  Nursing note and vitals reviewed. Constitutional: Angeliah Wisdom appears well-developed and well-nourished.  HENT:  Head: Normocephalic and atraumatic.  Eyes: Pupils are equal, round, and reactive to light. Conjunctivae are normal.  Neck: Normal range of motion.  Cardiovascular: Regular rhythm and normal heart sounds.  Respiratory: Effort normal.  GI: Soft.  Musculoskeletal: Normal  range of motion.  Neurological: Dalin Caldera is alert.  Skin: Skin is warm and dry.  Psychiatric: His affect is blunt. His speech is delayed. Miyuki Rzasa is slowed and withdrawn. Thought content is delusional. Cognition and memory are impaired. Richardson Dubree expresses impulsivity. Emiel Kielty expresses no homicidal and no suicidal ideation.    Review of Systems  Constitutional: Negative.   HENT: Negative.   Eyes: Negative.   Respiratory: Negative.  Cardiovascular: Negative.   Gastrointestinal: Negative.   Musculoskeletal: Negative.   Skin: Negative.   Neurological: Negative.   Psychiatric/Behavioral: Negative.     Blood pressure 112/72, pulse 78, temperature 98.6 F (37 C), temperature source Oral, resp. rate 16, height 5\' 9"  (1.753 m), weight 77.1 kg, SpO2 98 %.Body mass index is 25.1 kg/m.  General Appearance: Casual  Eye Contact:  Fair  Speech:  Garbled  Volume:  Decreased  Mood:  Euthymic  Affect:  Constricted  Thought Process:  Disorganized  Orientation:  Full (Time, Place, and Person)  Thought Content:  Rumination  Suicidal Thoughts:  No  Homicidal Thoughts:  No  Memory:  Immediate;   Fair Recent;   Poor Remote;   Poor  Judgement:  Impaired  Insight:  Shallow  Psychomotor Activity:  Decreased  Concentration:  Concentration: Poor  Recall:  Poor  Fund of Knowledge:  Poor  Language:  Poor  Akathisia:  No  Handed:  Right  AIMS (if indicated):     Assets:  Financial Resources/Insurance Housing  ADL's:  Impaired  Cognition:  Impaired,  Moderate  Sleep:  Number of Hours: 3.75     Treatment Plan Summary: Daily contact with patient to assess and evaluate symptoms and progress in treatment and Medication management   # Schizoaffective disorder -- continue Depakote 500mg  TID -- continue seroquel 100mg  qhs  -- continue Trazodone 100mg  qhs -- continue Klonopin 1mg  TID, however, consider taper the dose, as long-term use rarely effective, yet high risks for side effects and physical dependence.   #  Thrombocytopenia -- recheck on Monday, can cause by psychotic meds such as depakote.   -- UDS is still pending.   # Disposition: -- defer to primary team.    Mehar Sagen, MD 10/25/2018, 1:14 PM

## 2018-10-25 NOTE — [facility] Group Notes (Signed)
LCSW Group Therapy Note 10/25/2018 1:15pm  Type of Therapy and Topic: Group Therapy: Feelings Around Returning Home & Establishing a Supportive Framework and Supporting Oneself When Supports Not Available  Participation Level: Minimal  Description of Group:  Patients first processed thoughts and feelings about upcoming discharge. These included fears of upcoming changes, lack of change, new living environments, judgements and expectations from others and overall stigma of mental health issues. The group then discussed the definition of a supportive framework, what that looks and feels like, and how do to discern it from an unhealthy non-supportive network. The group identified different types of supports as well as what to do when your family/friends are less than helpful or unavailable  Therapeutic Goals  1. Patient will identify one healthy supportive network that they can use at discharge. 2. Patient will identify one factor of a supportive framework and how to tell it from an unhealthy network. 3. Patient able to identify one coping skill to use when they do not have positive supports from others. 4. Patient will demonstrate ability to communicate their needs through discussion and/or role plays.  Summary of Patient Progress:  The patient reported her feels "well. The patient did not participate after he introduced himself to the group.  Therapeutic Modalities Cognitive Behavioral Therapy Motivational Interviewing   Randy Ferguson  CUEBAS-COLON, LCSW 10/25/2018 11:58 AM

## 2018-10-25 NOTE — Progress Notes (Signed)
D: Patient has been cooperative. Continues to deny SI and HI. Has been up a couple of times during the night but has been appropriate. Confused at times, not remembering where his room is or where the bathroom is.  A: Continue to monitor for safety. R: Safety maintained.

## 2018-10-25 NOTE — Plan of Care (Signed)
D- Patient alert and oriented. Patient presents in a pleasant mood on assessment stating that he slept "ok" last night and had no major complaints or concerns to voice to this Clinical research associate. Patient can be logical/coherent at times, and then his speech becomes slurred and incomprehensible. Patient denies SI, HI, AVH, and pain at this time. Patient also denies any signs/symptoms of depression/anxiety, stating "no ma'am". Patient had no stated goals for today.  A- Scheduled medications administered to patient, per MD orders. Support and encouragement provided.  Routine safety checks conducted every 15 minutes.  Patient informed to notify staff with problems or concerns.  R- No adverse drug reactions noted. Patient contracts for safety at this time. Patient compliant with medications and treatment plan. Patient receptive, calm, and cooperative. Patient interacts well with others on the unit.  Patient remains safe at this time.  Problem: Education: Goal: Emotional status will improve Outcome: Progressing   Problem: Coping: Goal: Ability to verbalize frustrations and anger appropriately will improve Outcome: Progressing Goal: Ability to demonstrate self-control will improve Outcome: Progressing   Problem: Safety: Goal: Periods of time without injury will increase Outcome: Progressing

## 2018-10-26 ENCOUNTER — Inpatient Hospital Stay: Payer: Medicare Other

## 2018-10-26 LAB — CBC WITH DIFFERENTIAL/PLATELET
Abs Immature Granulocytes: 0.03 10*3/uL (ref 0.00–0.07)
Basophils Absolute: 0 10*3/uL (ref 0.0–0.1)
Basophils Relative: 0 %
Eosinophils Absolute: 0 10*3/uL (ref 0.0–0.5)
Eosinophils Relative: 0 %
HCT: 34.6 % — ABNORMAL LOW (ref 39.0–52.0)
Hemoglobin: 11 g/dL — ABNORMAL LOW (ref 13.0–17.0)
Immature Granulocytes: 1 %
Lymphocytes Relative: 46 %
Lymphs Abs: 2.1 10*3/uL (ref 0.7–4.0)
MCH: 32.5 pg (ref 26.0–34.0)
MCHC: 31.8 g/dL (ref 30.0–36.0)
MCV: 102.4 fL — ABNORMAL HIGH (ref 80.0–100.0)
Monocytes Absolute: 0.6 10*3/uL (ref 0.1–1.0)
Monocytes Relative: 13 %
Neutro Abs: 1.8 10*3/uL (ref 1.7–7.7)
Neutrophils Relative %: 40 %
Platelets: 85 10*3/uL — ABNORMAL LOW (ref 150–400)
RBC: 3.38 MIL/uL — ABNORMAL LOW (ref 4.22–5.81)
RDW: 14.8 % (ref 11.5–15.5)
WBC: 4.6 10*3/uL (ref 4.0–10.5)
nRBC: 0 % (ref 0.0–0.2)

## 2018-10-26 LAB — GLUCOSE, CAPILLARY: Glucose-Capillary: 119 mg/dL — ABNORMAL HIGH (ref 70–99)

## 2018-10-26 MED ORDER — QUETIAPINE FUMARATE 100 MG PO TABS: 100.0000 mg | ORAL_TABLET | Freq: Every day | ORAL | 1 refills | Status: AC

## 2018-10-26 NOTE — BHH Suicide Risk Assessment (Signed)
[facility] Discharge Suicide Risk Assessment   Principal Problem: Schizophrenia, chronic condition (HCC) Discharge Diagnoses: Principal Problem:   Schizophrenia, chronic condition (HCC) Active Problems:   Essential hypertension   Hypothyroidism   Tardive dyskinesia   Thrombocytopenia (HCC)   Prostate hypertrophy   Total Time spent with patient: 45 minutes  Musculoskeletal: Strength & Muscle Tone: within normal limits Gait & Station: normal Patient leans: N/A  Psychiatric Specialty Exam: Review of Systems  Constitutional: Negative.   HENT: Negative.   Eyes: Negative.   Respiratory: Negative.   Cardiovascular: Negative.   Gastrointestinal: Negative.   Musculoskeletal: Negative.   Skin: Negative.   Neurological: Negative.   Psychiatric/Behavioral: Negative.     Blood pressure 102/60, pulse 64, temperature 98.8 F (37.1 C), temperature source Oral, resp. rate 18, height 5\' 9"  (1.753 m), weight 77.1 kg, SpO2 98 %.Body mass index is 25.1 kg/m.  General Appearance: Disheveled  Eye Solicitor::  Fair  Speech:  Garbled and Slurred409  Volume:  Decreased  Mood:  Euthymic  Affect:  Constricted  Thought Process:  Disorganized  Orientation:  Negative  Thought Content:  Illogical, Rumination and Tangential  Suicidal Thoughts:  No  Homicidal Thoughts:  No  Memory:  Immediate;   Fair Recent;   Poor Remote;   Poor  Judgement:  Impaired  Insight:  Shallow  Psychomotor Activity:  Normal  Concentration:  Poor  Recall:  Poor  Fund of Knowledge:Poor  Language: Poor  Akathisia:  No  Handed:  Right  AIMS (if indicated):     Assets:  Social Support  Sleep:  Number of Hours: 3.75  Cognition: Impaired,  Moderate and Severe  ADL's:  Impaired   Mental Status Per Nursing Assessment::   On Admission:  NA  Demographic Factors:  Male  Loss Factors: NA  Historical Factors: Impulsivity  Risk Reduction Factors:   Living with another person, especially a relative, Positive social  support and Positive therapeutic relationship  Continued Clinical Symptoms:  Schizophrenia:   Paranoid or undifferentiated type  Cognitive Features That Contribute To Risk:  Loss of executive function    Suicide Risk:  Minimal: No identifiable suicidal ideation.  Patients presenting with no risk factors but with morbid ruminations; may be classified as minimal risk based on the severity of the depressive symptoms  Follow-up Information    [facility] Follow up.   Why:  Please attend appointment 10/28/2018 at 12:30.  This is a hospital discharge appointment to assess for earlier medication appointment with psychiatrist Contact information: [address] [place] [address] [phone]           Plan Of Care/Follow-up recommendations:  Activity:  Activity as tolerated Diet:  Regular diet Other:  Follow-up with outpatient care continue current medication.  Mordecai Rasmussen, MD 10/26/2018, 9:40 AM

## 2018-10-26 NOTE — Progress Notes (Signed)
Patient ID: Randy Ferguson, male   DOB: 1956/05/23, 63 y.o.   MRN: [id number]   Discharge Note:  Patient denies SI/HI/AVH at this time. Discharge instructions, AVS, prescriptions, and transition record given to patient. Patient belongings returned to patient. Patient agrees to comply with medication management, follow-up visit, and outpatient therapy. Patient had no questions or concerns at this time. Patient wheeled off unit. Patient discharged to group home with group home staff member.

## 2018-10-26 NOTE — Progress Notes (Signed)
  [facility] Adult Case Management Discharge Plan :  Will you be returning to the same living situation after discharge:  Yes,  pt returning to group home.  At discharge, do you have transportation home?: Yes,  group home will provide transportation.  Do you have the ability to pay for your medications: Yes,  Medicare  Release of information consent forms completed and in the chart;  Patient's signature needed at discharge.  Patient to Follow up at: Follow-up Information    [facility] Follow up.   Why:  Please attend appointment 10/28/2018 at 12:30.  This is a hospital discharge appointment to assess for earlier medication appointment with psychiatrist Contact information: [address] [facility] [address] [phone]           Next level of care provider has access to [facility] Link:no  Safety Planning and Suicide Prevention discussed: Yes,  SPE completed with pt's guardian.  Have you used any form of tobacco in the last 30 days? (Cigarettes, Smokeless Tobacco, Cigars, and/or Pipes): Yes  Has patient been referred to the Quitline?: Patient refused referral  Patient has been referred for addiction treatment: N/A  Harden Mo, LCSW 10/26/2018, 9:18 AM

## 2018-10-26 NOTE — Plan of Care (Signed)
  Problem: Coping: Goal: Ability to verbalize frustrations and anger appropriately will improve Outcome: Not Progressing Goal: Ability to demonstrate self-control will improve Outcome: Not Progressing   D: Patient has been up and down all night. Denies SI, HI and AV hallucinations. Patient is confused. Forgets where his room is. Asking to be taken to the rest home. Reluctant to take his medications. Saying that his medications are making him sick. A: Continue to monitor for safety. R: Safety maintained
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
## 2018-10-26 NOTE — Progress Notes (Signed)
Recreation Therapy Notes  INPATIENT RECREATION TR PLAN  Patient Details Name: Randy Ferguson MRN: [id number] DOB: March 24, 1956 Today's Date: 10/26/2018  Rec Therapy Plan Is patient appropriate for Therapeutic Recreation?: Yes Treatment times per week: at least 3 Estimated Length of Stay: 5-7 days TR Treatment/Interventions: Group participation (Comment)  Discharge Criteria Pt will be discharged from therapy if:: Discharged Treatment plan/goals/alternatives discussed and agreed upon by:: Patient/family  Discharge Summary Short term goals set: Patient will focus on task/topic with 2 prompts from staff within 5 recreation therapy group sessions Short term goals met: Complete Progress toward goals comments: Groups attended Which groups?: Other (Comment)(Problem Solving, Team work, Emotions) Reason goals not met: N/A Therapeutic equipment acquired: N/A Reason patient discharged from therapy: Discharge from hospital Pt/family agrees with progress & goals achieved: Yes Date patient discharged from therapy: 10/26/18   Toyia Jelinek 10/26/2018, 11:53 AM

## 2018-10-26 NOTE — Progress Notes (Signed)
Patient refused to take his synthroid unless he took it with food and this Thereasa Parkin was unable to make him understand that he could not take it with food that it had to be taken on an empty stomach and that he could not eat for thirty minutes after taking it.

## 2018-10-26 NOTE — Progress Notes (Signed)
Recreation Therapy Notes    Date: 10/26/2018  Time: 9:30 am  Location: Craft room  Behavioral response: Appropriate   Intervention Topic: Problem Solving  Discussion/Intervention:  Group content on today was focused on problem solving. The group described what problem solving is. Patients expressed how problems affect them and how they deal with problems. Individuals identified healthy ways to deal with problems. Patients explained what normally happens to them when they do not deal with problems. The group expressed reoccurring problems for them. The group participated in the intervention "Ways to Solve problems" where patients were given a chance to explore different ways to solve problems.   Clinical Observations/Feedback:  Patient came to group late due to unknown reasons.  Individual was social with peers and staff while participating in the intervention. Alayla Dethlefs LRT/CTRS         Shareese Macha 10/26/2018 11:24 AM

## 2018-10-26 NOTE — BHH Group Notes (Signed)
LCSW Group Therapy Note   10/26/2018 1:00 PM  Type of Therapy and Topic:  Group Therapy:  Overcoming Obstacles   Participation Level:  None   Description of Group:    In this group patients will be encouraged to explore what they see as obstacles to their own wellness and recovery. They will be guided to discuss their thoughts, feelings, and behaviors related to these obstacles. The group will process together ways to cope with barriers, with attention given to specific choices patients can make. Each patient will be challenged to identify changes they are motivated to make in order to overcome their obstacles. This group will be process-oriented, with patients participating in exploration of their own experiences as well as giving and receiving support and challenge from other group members.   Therapeutic Goals: 1. Patient will identify personal and current obstacles as they relate to admission. 2. Patient will identify barriers that currently interfere with their wellness or overcoming obstacles.  3. Patient will identify feelings, thought process and behaviors related to these barriers. 4. Patient will identify two changes they are willing to make to overcome these obstacles:      Summary of Patient Progress Patient arrived at the end of group and did not participate in discussion.  Patient appeared to be unsteady on his feet and CSW had MHT support pt as he left group.    Therapeutic Modalities:   Cognitive Behavioral Therapy Solution Focused Therapy Motivational Interviewing Relapse Prevention Therapy  [name], MSW, LCSW 10/26/2018 12:45 PM

## 2018-10-26 NOTE — Progress Notes (Signed)
D: Patient has been up and down all night. Denies SI, HI and AV hallucinations. Patient is confused. Forgets where his room is. Asking to be taken to the rest home. Reluctant to take his medications. Saying that his medications are making him sick. A: Continue to monitor for safety. R: Safety maintained

## 2018-10-26 NOTE — Progress Notes (Signed)
Patient stated that he was "dizzy in the head" so MD instructed this writer that it's fine to hold patient's noon medications.

## 2018-10-26 NOTE — Significant Event (Signed)
Pt was in dayroom with staff. He was witnessed stumbling one time and caught his balance. He was encouraged by MHT to stay sitting and ask for assistance when needed. Pt later got up and attempted to walk. He stumbled again and fell on his left knee and struck the back of his head on left side. Pt denies pain. Vital signs were taken immediately after event and were within normal range. No signs of injury or bruising noted to knee. No swelling or crepitus noted in head. Pt stated he hit his knee, but witnesses stated he hit his knee and head. Left pupil approximately 79mm and reactive to light. Right pupil is approximately 67mm and nonreactive to light. Provider notified. Pt reports no known vision abnormalities no or at baseline, however pt is not a reliable historian. Pt was able to stand. Pt is speaking at his normal baseline level. Will continue to monitor.

## 2018-11-15 ENCOUNTER — Other Ambulatory Visit: Payer: Self-pay

## 2018-11-15 ENCOUNTER — Emergency Department
Admission: EM | Admit: 2018-11-15 | Discharge: 2018-11-18 | Disposition: A | Discharge status: Other Acute Inpatient Hospital | Payer: Medicare Other | Attending: Emergency Medicine | Admitting: Emergency Medicine

## 2018-11-15 ENCOUNTER — Encounter: Payer: Self-pay | Admitting: Intensive Care

## 2018-11-15 DIAGNOSIS — Z046 Encounter for general psychiatric examination, requested by authority: Secondary | ICD-10-CM | POA: Insufficient documentation

## 2018-11-15 DIAGNOSIS — R4689 Other symptoms and signs involving appearance and behavior: Secondary | ICD-10-CM | POA: Diagnosis present

## 2018-11-15 DIAGNOSIS — E039 Hypothyroidism, unspecified: Secondary | ICD-10-CM | POA: Insufficient documentation

## 2018-11-15 DIAGNOSIS — F209 Schizophrenia, unspecified: Secondary | ICD-10-CM | POA: Diagnosis present

## 2018-11-15 DIAGNOSIS — R509 Fever, unspecified: Secondary | ICD-10-CM | POA: Diagnosis not present

## 2018-11-15 DIAGNOSIS — I10 Essential (primary) hypertension: Secondary | ICD-10-CM | POA: Insufficient documentation

## 2018-11-15 DIAGNOSIS — Z79899 Other long term (current) drug therapy: Secondary | ICD-10-CM | POA: Insufficient documentation

## 2018-11-15 DIAGNOSIS — F17210 Nicotine dependence, cigarettes, uncomplicated: Secondary | ICD-10-CM | POA: Insufficient documentation

## 2018-11-15 DIAGNOSIS — G2401 Drug induced subacute dyskinesia: Secondary | ICD-10-CM | POA: Diagnosis present

## 2018-11-15 DIAGNOSIS — Z20828 Contact with and (suspected) exposure to other viral communicable diseases: Secondary | ICD-10-CM | POA: Diagnosis not present

## 2018-11-15 LAB — COMPREHENSIVE METABOLIC PANEL
ALT: 24 U/L (ref 0–44)
AST: 27 U/L (ref 15–41)
Albumin: 3.6 g/dL (ref 3.5–5.0)
Alkaline Phosphatase: 75 U/L (ref 38–126)
Anion gap: 7 (ref 5–15)
BUN: 18 mg/dL (ref 6–20)
CO2: 27 mmol/L (ref 22–32)
Calcium: 9.7 mg/dL (ref 8.9–10.3)
Chloride: 105 mmol/L (ref 98–111)
Creatinine, Ser: 0.84 mg/dL (ref 0.61–1.24)
GFR calc Af Amer: >60 mL/min (ref >60)
GFR calc non Af Amer: >60 mL/min (ref >60)
Glucose, Bld: 127 mg/dL — ABNORMAL HIGH (ref 70–99)
Potassium: 4 mmol/L (ref 3.5–5.1)
Sodium: 139 mmol/L (ref 135–145)
Total Bilirubin: 0.6 mg/dL (ref 0.3–1.2)
Total Protein: 8.1 g/dL (ref 6.5–8.1)

## 2018-11-15 LAB — ACETAMINOPHEN LEVEL: Acetaminophen (Tylenol), Serum: <10 ug/mL — ABNORMAL LOW (ref 10–30)

## 2018-11-15 LAB — URINE DRUG SCREEN, QUALITATIVE (ARMC ONLY)
Amphetamines, Ur Screen: NOT DETECTED
Barbiturates, Ur Screen: NOT DETECTED
Benzodiazepine, Ur Scrn: POSITIVE — AB
Cannabinoid 50 Ng, Ur ~~LOC~~: NOT DETECTED
Cocaine Metabolite,Ur ~~LOC~~: NOT DETECTED
MDMA (Ecstasy)Ur Screen: NOT DETECTED
Methadone Scn, Ur: NOT DETECTED
Opiate, Ur Screen: NOT DETECTED
Phencyclidine (PCP) Ur S: NOT DETECTED
Tricyclic, Ur Screen: POSITIVE — AB

## 2018-11-15 LAB — CBC
HCT: 39.1 % (ref 39.0–52.0)
Hemoglobin: 12.8 g/dL — ABNORMAL LOW (ref 13.0–17.0)
MCH: 32.2 pg (ref 26.0–34.0)
MCHC: 32.7 g/dL (ref 30.0–36.0)
MCV: 98.2 fL (ref 80.0–100.0)
Platelets: 106 10*3/uL — ABNORMAL LOW (ref 150–400)
RBC: 3.98 MIL/uL — ABNORMAL LOW (ref 4.22–5.81)
RDW: 13.8 % (ref 11.5–15.5)
WBC: 4.4 10*3/uL (ref 4.0–10.5)
nRBC: 0 % (ref 0.0–0.2)

## 2018-11-15 LAB — ETHANOL: Alcohol, Ethyl (B): <10 mg/dL (ref <10)

## 2018-11-15 LAB — SALICYLATE LEVEL: Salicylate Lvl: <7 mg/dL (ref 2.8–30.0)

## 2018-11-15 NOTE — ED Notes (Signed)
Pt given meal tray per Dr Cinda Quest

## 2018-11-15 NOTE — BH Assessment (Signed)
Writer called and left a HIPPA Compliant message with [place] ([place]), requesting a return phone call.

## 2018-11-15 NOTE — ED Notes (Signed)
Gave patient turkey tray and gingerale.AS °
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
## 2018-11-15 NOTE — BH Assessment (Signed)
Assessment Note  Randy Ferguson is an 63 y.o. male who presents to the ER via law enforcement after he was found at a local gas station attempting to purchase a lottery ticket, only having on an adult diaper.  Per Group Home Nurse, after the patient discharged from the [facility] BMU, he's started wondering from the [place]. They believe it is due to his "Clozaril" getting discontinued. Group Home Nurse states they are familiar with his psychosis and able to manage them. However, the wandering from the [place] is new and they are unable to manage it.  Patient was difficult to engage. His speech was mumbled and difficult to understand.  Diagnosis: Schizophrenia  Past Medical History:  Past Medical History:  Diagnosis Date  . GERD (gastroesophageal reflux disease)   . Hypertension   . Schizophrenia ([place])   . Thrombocytopenia ([place])   . Thyroid disease     History reviewed. No pertinent surgical history.  Family History: History reviewed. No pertinent family history.  Social History:  reports that he has been smoking cigarettes. He has been smoking about 0.50 packs per day. He has never used smokeless tobacco. He reports previous alcohol use. He reports previous drug use.  Additional Social History:  Alcohol / Drug Use Pain Medications: See PTA Prescriptions: See PTA Over the Counter: See PTA History of alcohol / drug use?: No history of alcohol / drug abuse Longest period of sobriety (when/how long): n/a Negative Consequences of Use: (n/a) Withdrawal Symptoms: (n/a)  CIWA: CIWA-Ar BP: 99/76 Pulse Rate: (!) 122 COWS:    Allergies: No Known Allergies  Home Medications: (Not in a hospital admission)   OB/GYN Status:  No LMP for male patient.  General Assessment Data Location of Assessment: [facility] ED TTS Assessment: In system Is this a Tele or Face-to-Face Assessment?: Face-to-Face Is this an Initial Assessment or a Re-assessment for this encounter?: Initial  Assessment Language Other than English: No Living Arrangements: In Group Home: (Comment: Name of Group Home)([facility]) What gender do you identify as?: Male Marital status: Single Pregnancy Status: No Living Arrangements: Group Home([facility]) Can pt return to current living arrangement?: Yes Admission Status: Involuntary Petitioner: Police Is patient capable of signing voluntary admission?: No(Under IVC) Referral Source: Other(Group Home) Insurance type: Medicare A&B  Medical Screening Exam ([place]) Medical Exam completed: Yes  Crisis Care Plan Living Arrangements: Group Home([facility]) Name of Psychiatrist: [facility]) Name of Therapist: [facility]  Education Status Is patient currently in school?: No Highest grade of school patient has completed: 11th Is the patient employed, unemployed or receiving disability?: Unemployed, Receiving disability income  Risk to self with the past 6 months Suicidal Ideation: No Has patient been a risk to self within the past 6 months prior to admission? : No Suicidal Intent: No Has patient had any suicidal intent within the past 6 months prior to admission? : No Is patient at risk for suicide?: No Suicidal Plan?: No Has patient had any suicidal plan within the past 6 months prior to admission? : No Access to Means: No What has been your use of drugs/alcohol within the last 12 months?: None Reports Previous Attempts/Gestures: No How many times?: 0 Other Self Harm Risks: Reports of none Triggers for Past Attempts: None known Intentional Self Injurious Behavior: None Family Suicide History: No Recent stressful life event(s): Other (Comment) Persecutory voices/beliefs?: No Depression: No Depression Symptoms: (Reports of none) Substance abuse history and/or treatment  for substance abuse?: No Suicide prevention information given to non-admitted patients: Not applicable  Risk to  Others within the past 6 months Homicidal Ideation: No Does patient have any lifetime risk of violence toward others beyond the six months prior to admission? : No Thoughts of Harm to Others: No Current Homicidal Intent: No Current Homicidal Plan: No Access to Homicidal Means: No Identified Victim: Reports of none History of harm to others?: No Assessment of Violence: None Noted Violent Behavior Description: Reports of none Does patient have access to weapons?: No Criminal Charges Pending?: No Does patient have a court date: No Is patient on probation?: No  Psychosis Hallucinations: None noted Delusions: None noted  Mental Status Report Appearance/Hygiene: Unremarkable, In scrubs Eye Contact: Good Motor Activity: Freedom of movement, Unremarkable Speech: Unable to assess Level of Consciousness: Unable to assess Mood: Pleasant Affect: Unable to Assess Anxiety Level: None Thought Processes: Coherent, Relevant Judgement: Partial Orientation: Person, Place, Time, Situation, Appropriate for developmental age Obsessive Compulsive Thoughts/Behaviors: Unable to Assess  Cognitive Functioning Concentration: Unable to Assess Memory: Unable to Assess Is patient IDD: No Insight: Unable to Assess Impulse Control: Unable to Assess Appetite: Fair Have you had any weight changes? : No Change Sleep: Unable to Assess Total Hours of Sleep: 8 Vegetative Symptoms: None  ADLScreening [facility] Assessment Services) Patient's cognitive ability adequate to safely complete daily activities?: Yes Patient able to express need for assistance with ADLs?: Yes Independently performs ADLs?: Yes (appropriate for developmental age)  Prior Inpatient Therapy Prior Inpatient Therapy: Yes Prior Therapy Dates: 02/2013, 12/2012, 10/2018 & 12/2012 [facility] Reason for Treatment: Schizophrenia  Prior Outpatient Therapy Prior Outpatient Therapy: Yes Prior Therapy Dates:  Current Prior Therapy Facilty/Provider(s): [facility] Reason for Treatment: Schizophrenia Does patient have an ACCT team?: No Does patient have Intensive In-House Services?  : No Does patient have [facility] services? : No Does patient have P4CC services?: No  ADL Screening (condition at time of admission) Patient's cognitive ability adequate to safely complete daily activities?: Yes Is the patient deaf or have difficulty hearing?: No Does the patient have difficulty seeing, even when wearing glasses/contacts?: No Does the patient have difficulty concentrating, remembering, or making decisions?: No Patient able to express need for assistance with ADLs?: Yes Does the patient have difficulty dressing or bathing?: No Independently performs ADLs?: Yes (appropriate for developmental age) Does the patient have difficulty walking or climbing stairs?: No Weakness of Legs: None Weakness of Arms/Hands: None  Home Assistive Devices/Equipment Home Assistive Devices/Equipment: None  Therapy Consults (therapy consults require a physician order) PT Evaluation Needed: No OT Evalulation Needed: No SLP Evaluation Needed: No Abuse/Neglect Assessment (Assessment to be complete while patient is alone) Abuse/Neglect Assessment Can Be Completed: Yes Physical Abuse: Denies Verbal Abuse: Denies Sexual Abuse: Denies Exploitation of patient/patient's resources: Denies Self-Neglect: Denies Values / Beliefs Cultural Requests During Hospitalization: None Spiritual Requests During Hospitalization: None Consults Spiritual Care Consult Needed: No Social Work Consult Needed: No Merchant navy officerAdvance Directives (For Healthcare) Does Patient Have a Medical Advance Directive?: No Would patient like information on creating a medical advance directive?: No - Patient declined       Child/Adolescent Assessment Running Away Risk: Denies(Patient is an adult)  Disposition:  Disposition Initial Assessment Completed for this  Encounter: Yes  On Site Evaluation by:   Reviewed with Physician:    Lilyan Gilfordalvin J. Katja Blue MS, LCAS, [facility], NCC, CCSI Therapeutic Triage Specialist 11/15/2018 7:17 PM

## 2018-11-16 ENCOUNTER — Emergency Department: Payer: Medicare Other

## 2018-11-16 DIAGNOSIS — F201 Disorganized schizophrenia: Secondary | ICD-10-CM | POA: Diagnosis not present

## 2018-11-16 DIAGNOSIS — G2401 Drug induced subacute dyskinesia: Secondary | ICD-10-CM | POA: Diagnosis not present

## 2018-11-16 DIAGNOSIS — F209 Schizophrenia, unspecified: Secondary | ICD-10-CM | POA: Diagnosis not present

## 2018-11-16 LAB — URINALYSIS, COMPLETE (UACMP) WITH MICROSCOPIC
Bacteria, UA: NONE SEEN
Bilirubin Urine: NEGATIVE
Glucose, UA: NEGATIVE mg/dL
Hgb urine dipstick: NEGATIVE
Ketones, ur: NEGATIVE mg/dL
Leukocytes,Ua: NEGATIVE
Nitrite: NEGATIVE
Protein, ur: NEGATIVE mg/dL
Specific Gravity, Urine: 1.02 (ref 1.005–1.030)
Squamous Epithelial / LPF: NONE SEEN (ref 0–5)
WBC, UA: NONE SEEN WBC/hpf (ref 0–5)
pH: 5 (ref 5.0–8.0)

## 2018-11-16 LAB — CBC WITH DIFFERENTIAL/PLATELET
Abs Immature Granulocytes: 0.01 10*3/uL (ref 0.00–0.07)
Basophils Absolute: 0 10*3/uL (ref 0.0–0.1)
Basophils Relative: 0 %
Eosinophils Absolute: 0 10*3/uL (ref 0.0–0.5)
Eosinophils Relative: 0 %
HCT: 38.9 % — ABNORMAL LOW (ref 39.0–52.0)
Hemoglobin: 12.8 g/dL — ABNORMAL LOW (ref 13.0–17.0)
Immature Granulocytes: 0 %
Lymphocytes Relative: 42 %
Lymphs Abs: 1.8 10*3/uL (ref 0.7–4.0)
MCH: 32.4 pg (ref 26.0–34.0)
MCHC: 32.9 g/dL (ref 30.0–36.0)
MCV: 98.5 fL (ref 80.0–100.0)
Monocytes Absolute: 0.3 10*3/uL (ref 0.1–1.0)
Monocytes Relative: 7 %
Neutro Abs: 2.1 10*3/uL (ref 1.7–7.7)
Neutrophils Relative %: 51 %
Platelets: 106 10*3/uL — ABNORMAL LOW (ref 150–400)
RBC: 3.95 MIL/uL — ABNORMAL LOW (ref 4.22–5.81)
RDW: 13.9 % (ref 11.5–15.5)
WBC: 4.2 10*3/uL (ref 4.0–10.5)
nRBC: 0 % (ref 0.0–0.2)

## 2018-11-16 LAB — SARS CORONAVIRUS 2 BY RT PCR (HOSPITAL ORDER, PERFORMED IN ~~LOC~~ HOSPITAL LAB): SARS Coronavirus 2: NEGATIVE

## 2018-11-16 MED ORDER — DIVALPROEX SODIUM 500 MG PO DR TAB
500.0000 mg | DELAYED_RELEASE_TABLET | Freq: Three times a day (TID) | ORAL | Status: DC
  Administered 2018-11-16 – 2018-11-18 (×6): 500 mg via ORAL
  Filled 2018-11-16 (×8): qty 1

## 2018-11-16 MED ORDER — QUETIAPINE FUMARATE 100 MG PO TABS
100.0000 mg | ORAL_TABLET | Freq: Every day | ORAL | Status: DC
  Administered 2018-11-16: 21:00:00 100 mg via ORAL
  Filled 2018-11-16: qty 1
  Filled 2018-11-16: qty 4
  Filled 2018-11-16: qty 1

## 2018-11-16 MED ORDER — FOLIC ACID 1 MG PO TABS
1.0000 mg | ORAL_TABLET | Freq: Every day | ORAL | Status: DC
  Administered 2018-11-16 – 2018-11-18 (×3): 1 mg via ORAL
  Filled 2018-11-16 (×3): qty 1

## 2018-11-16 MED ORDER — HYDROCHLOROTHIAZIDE 12.5 MG PO CAPS
12.5000 mg | ORAL_CAPSULE | Freq: Every day | ORAL | Status: DC
  Administered 2018-11-16 – 2018-11-18 (×3): 12.5 mg via ORAL
  Filled 2018-11-16 (×3): qty 1

## 2018-11-16 MED ORDER — MELATONIN 5 MG PO TABS
1.0000 | ORAL_TABLET | Freq: Every day | ORAL | Status: DC
  Administered 2018-11-17: 22:00:00 5 mg via ORAL
  Filled 2018-11-16 (×3): qty 1

## 2018-11-16 MED ORDER — LISINOPRIL-HYDROCHLOROTHIAZIDE 10-12.5 MG PO TABS: 1.0000 | ORAL_TABLET | Freq: Every day | ORAL | Status: DC

## 2018-11-16 MED ORDER — TAMSULOSIN HCL 0.4 MG PO CAPS
0.4000 mg | ORAL_CAPSULE | Freq: Every day | ORAL | Status: DC
  Administered 2018-11-16 – 2018-11-18 (×3): 0.4 mg via ORAL
  Filled 2018-11-16 (×3): qty 1

## 2018-11-16 MED ORDER — DOCUSATE SODIUM 100 MG PO CAPS
100.0000 mg | ORAL_CAPSULE | Freq: Two times a day (BID) | ORAL | Status: DC
  Administered 2018-11-16 – 2018-11-18 (×4): 100 mg via ORAL
  Filled 2018-11-16 (×4): qty 1

## 2018-11-16 MED ORDER — LEVOTHYROXINE SODIUM 200 MCG PO TABS
200.0000 ug | ORAL_TABLET | Freq: Every day | ORAL | Status: DC
  Administered 2018-11-17 – 2018-11-18 (×2): 200 ug via ORAL
  Filled 2018-11-16: qty 1
  Filled 2018-11-16: qty 4
  Filled 2018-11-16: qty 1

## 2018-11-16 MED ORDER — CLONAZEPAM 1 MG PO TABS
1.0000 mg | ORAL_TABLET | Freq: Three times a day (TID) | ORAL | Status: DC
  Administered 2018-11-16 – 2018-11-18 (×7): 1 mg via ORAL
  Filled 2018-11-16: qty 1
  Filled 2018-11-16: qty 2
  Filled 2018-11-16: qty 1
  Filled 2018-11-16: qty 2
  Filled 2018-11-16 (×3): qty 1

## 2018-11-16 MED ORDER — NICOTINE POLACRILEX 2 MG MT GUM
2.0000 mg | CHEWING_GUM | OROMUCOSAL | Status: DC
  Administered 2018-11-16 – 2018-11-18 (×4): 2 mg via ORAL
  Filled 2018-11-16 (×12): qty 1

## 2018-11-16 MED ORDER — VITAMIN B-12 1000 MCG PO TABS: 1000.0000 ug | ORAL_TABLET | ORAL | Status: DC

## 2018-11-16 MED ORDER — PANTOPRAZOLE SODIUM 40 MG PO TBEC
40.0000 mg | DELAYED_RELEASE_TABLET | Freq: Every day | ORAL | Status: DC
  Administered 2018-11-16 – 2018-11-18 (×3): 40 mg via ORAL
  Filled 2018-11-16 (×3): qty 1

## 2018-11-16 MED ORDER — LISINOPRIL 5 MG PO TABS
10.0000 mg | ORAL_TABLET | Freq: Every day | ORAL | Status: DC
  Administered 2018-11-16 – 2018-11-18 (×3): 10 mg via ORAL
  Filled 2018-11-16: qty 1
  Filled 2018-11-16 (×2): qty 2

## 2018-11-16 MED ORDER — VITAMIN B-12 1000 MCG PO TABS
1000.0000 ug | ORAL_TABLET | ORAL | Status: DC
  Administered 2018-11-16 – 2018-11-18 (×2): 1000 ug via ORAL
  Filled 2018-11-16 (×2): qty 1

## 2018-11-16 MED ORDER — OXYBUTYNIN CHLORIDE 5 MG PO TABS
5.0000 mg | ORAL_TABLET | Freq: Two times a day (BID) | ORAL | Status: DC
  Administered 2018-11-16 – 2018-11-18 (×4): 5 mg via ORAL
  Filled 2018-11-16 (×6): qty 1

## 2018-11-16 MED ORDER — FINASTERIDE 5 MG PO TABS
5.0000 mg | ORAL_TABLET | Freq: Every day | ORAL | Status: DC
  Administered 2018-11-16 – 2018-11-18 (×3): 5 mg via ORAL
  Filled 2018-11-16 (×3): qty 1

## 2018-11-16 MED ORDER — TRAZODONE HCL 50 MG PO TABS
150.0000 mg | ORAL_TABLET | Freq: Every day | ORAL | Status: DC
  Administered 2018-11-16 – 2018-11-17 (×2): 150 mg via ORAL
  Filled 2018-11-16 (×2): qty 3

## 2018-11-16 NOTE — BH Assessment (Signed)
Referral information for Psychiatric Hospitalization faxed to;   [facility] [phone]),   . [facility] ([phone]--[phone])  . [facility] ([phone]) [phone]  . [facility] [phone] -or- [phone]) [phone]  . [facility] ([phone]---[phone]),  . [facility] [phone], [phone], [phone] or [phone]),   . [facility] [phone] or [phone])  . [facility] [phone] or [phone]),   . [facility] [phone]).

## 2018-11-16 NOTE — ED Notes (Signed)
Pt transferred into ED BHU room 3   Patient assigned to appropriate care area. Patient oriented to unit/care area: Informed that, for his safety, care areas are designed for safety and monitored by security cameras at all times;  phone times explained to patient. Patient verbalizes understanding, and verbal contract for safety obtained.    He denies pain  Assessment completed

## 2018-11-16 NOTE — ED Notes (Signed)
BEHAVIORAL HEALTH ROUNDING Patient sleeping: No. Patient alert and oriented: yes Behavior appropriate: Yes.  ; If no, describe:  Nutrition and fluids offered: yes Toileting and hygiene offered: Yes  Sitter present: q15 minute observations and security camera monitoring Law enforcement present: Yes  ODS  

## 2018-11-16 NOTE — ED Notes (Signed)
Pt returned from xray

## 2018-11-16 NOTE — ED Provider Notes (Signed)
[facility] Emergency Department Provider Note   ____________________________________________   First MD Initiated Contact with Patient 11/15/18 1739     (approximate)  I have reviewed the triage vital signs and the nursing notes.   HISTORY  Chief Complaint No chief complaint on file. Chief complaint is schizophrenic  HPI Randy Ferguson is a 63 y.o. male who is schizophrenic.  He is leaving the group home and walking around in the street and dressed only in his diaper.  He has walked out into traffic as well.  He is acting like a danger to himself.  He freely admits this.  We will get him committed.         Past Medical History:  Diagnosis Date  . GERD (gastroesophageal reflux disease)   . Hypertension   . Schizophrenia (HCC)   . Thrombocytopenia (HCC)   . Thyroid disease     Patient Active Problem List   Diagnosis Date Noted  . Schizophrenia (HCC) 10/21/2018  . Schizophrenia, chronic condition (HCC) 10/21/2018  . Essential hypertension 10/21/2018  . Hypothyroidism 10/21/2018  . Tardive dyskinesia 10/21/2018  . Thrombocytopenia (HCC) 10/21/2018  . Prostate hypertrophy 10/21/2018    History reviewed. No pertinent surgical history.  Prior to Admission medications   Medication Sig Start Date End Date Taking? Authorizing Provider  ARIPiprazole ER (ABILIFY MAINTENA) 400 MG SRER injection Inject 400 mg into the muscle every 28 (twenty-eight) days.    [provider]  clonazePAM (KLONOPIN) 1 MG tablet Take 1 tablet (1 mg total) by mouth 3 (three) times daily. 10/23/18   Clapacs, Jackquline DenmarkJohn T, MD  divalproex (DEPAKOTE) 500 MG DR tablet Take 500 mg by mouth 3 (three) times daily.    [provider]  docusate sodium (COLACE) 100 MG capsule Take 100 mg by mouth 2 (two) times daily.    [provider]  finasteride (PROSCAR) 5 MG tablet Take 5 mg by mouth daily.    [provider]  folic acid (FOLVITE) 1 MG tablet Take 1  mg by mouth daily.    [provider]  levothyroxine (SYNTHROID) 200 MCG tablet Take 1 tablet (200 mcg total) by mouth daily before breakfast. 10/24/18   Clapacs, Jackquline DenmarkJohn T, MD  lisinopril-hydrochlorothiazide (ZESTORETIC) 10-12.5 MG tablet Take 1 tablet by mouth daily. 10/23/18   Clapacs, Jackquline DenmarkJohn T, MD  Melatonin 5 MG TABS Take 1 tablet by mouth at bedtime.    [provider]  omeprazole (PRILOSEC) 20 MG capsule Take 20 mg by mouth daily.    [provider]  oxybutynin (DITROPAN) 5 MG tablet Take 5 mg by mouth 2 (two) times daily.    [provider]  QUEtiapine (SEROQUEL) 100 MG tablet Take 1 tablet (100 mg total) by mouth at bedtime. 10/26/18   Clapacs, Jackquline DenmarkJohn T, MD  tamsulosin (FLOMAX) 0.4 MG CAPS capsule Take 0.4 mg by mouth daily.    [provider]  traZODone (DESYREL) 150 MG tablet Take 1 tablet (150 mg total) by mouth at bedtime. 10/23/18   Clapacs, Jackquline DenmarkJohn T, MD  vitamin B-12 (CYANOCOBALAMIN) 1000 MCG tablet Take 1,000 mcg by mouth as directed. Take one tablet on Monday, weds, fri and sat    [provider]    Allergies Patient has no known allergies.  History reviewed. No pertinent family history.  Social History Social History   Tobacco Use  . Smoking status: Current Every Day Smoker    Packs/day: 0.50    Types: Cigarettes  . Smokeless tobacco:  Never Used  Substance Use Topics  . Alcohol use: Not Currently  . Drug use: Not Currently    Review of Systems  Constitutional: No fever/chills Eyes: No visual changes. ENT: No sore throat. Cardiovascular: Denies chest pain. Respiratory: Denies shortness of breath. Gastrointestinal: No abdominal pain.  No nausea, no vomiting.  No diarrhea.  No constipation. Genitourinary: Negative for dysuria. Musculoskeletal: Negative for back pain. Skin: Negative for rash. Neurological: Negative for headaches, focal weakness   ____________________________________________   PHYSICAL EXAM:  VITAL  SIGNS: ED Triage Vitals  Enc Vitals Group     BP 11/15/18 1634 99/76     Pulse Rate 11/15/18 1634 (!) 122     Resp 11/15/18 1634 18     Temp 11/15/18 1634 100.2 F (37.9 C)     Temp Source 11/15/18 1634 Oral     SpO2 11/15/18 1634 95 %     Weight 11/15/18 1640 170 lb (77.1 kg)     Height 11/15/18 1640 5\' 9"  (1.753 m)     Head Circumference --      Peak Flow --      Pain Score 11/15/18 1639 0     Pain Loc --      Pain Edu? --      Excl. in [place]? --     Constitutional: Alert and oriented.  In no acute distress Eyes: Conjunctivae are normal.  Head: Atraumatic. Nose: No congestion/rhinnorhea. Mouth/Throat: Mucous membranes are moist.  Oropharynx non-erythematous.  He has movements that appear to be consistent with her alive dyskinesia. Neck: No stridor.   Cardiovascular: Normal rate, regular rhythm. Grossly normal heart sounds.  Good peripheral circulation. Respiratory: Normal respiratory effort.  No retractions. Lungs CTAB. Gastrointestinal: Soft and nontender. No distention. No abdominal bruits. No CVA tenderness. Musculoskeletal: No lower extremity tenderness nor edema.   Neurologic:  Normal speech and language. No gross focal neurologic deficits are appreciated. Skin:  Skin is warm, dry and intact. No rash noted.   ____________________________________________   LABS (all labs ordered are listed, but only abnormal results are displayed)  Labs Reviewed  COMPREHENSIVE METABOLIC PANEL - Abnormal; Notable for the following components:      Result Value   Glucose, Bld 127 (*)    All other components within normal limits  ACETAMINOPHEN LEVEL - Abnormal; Notable for the following components:   Acetaminophen (Tylenol), Serum <10 (*)    All other components within normal limits  CBC - Abnormal; Notable for the following components:   RBC 3.98 (*)    Hemoglobin 12.8 (*)    Platelets 106 (*)    All other components within normal limits  URINE DRUG SCREEN, QUALITATIVE ([facility] ONLY)  - Abnormal; Notable for the following components:   Tricyclic, Ur Screen POSITIVE (*)    Benzodiazepine, Ur Scrn POSITIVE (*)    All other components within normal limits  URINE CULTURE  ETHANOL  SALICYLATE LEVEL  DIFFERENTIAL  URINALYSIS, COMPLETE (UACMP) WITH MICROSCOPIC   ____________________________________________  EKG   ____________________________________________  RADIOLOGY  ED MD interpretation:    Official radiology report(s): No results found.  ____________________________________________   PROCEDURES  Procedure(s) performed (including Critical Care):  Procedures   ____________________________________________   INITIAL IMPRESSION / ASSESSMENT AND PLAN / ED COURSE  We are waiting for [facility] to determine what to do with this patient further.    Patient does have a low-grade fever and is tachycardic.  We will have to do some more work with him to evaluate him.  ____________________________________________   FINAL CLINICAL IMPRESSION(S) / ED DIAGNOSES  Final diagnoses:  Schizophrenia, unspecified type [facility](HCC)     ED Discharge Orders    None       Note:  This document was prepared using Dragon voice recognition software and may include unintentional dictation errors.    Arnaldo NatalMalinda, Tabatha Razzano F, MD 11/16/18 [phone]

## 2018-11-16 NOTE — ED Notes (Signed)
Pt to xray via wheelchair with Electronics engineer.

## 2018-11-16 NOTE — ED Notes (Signed)

## 2018-11-16 NOTE — ED Provider Notes (Signed)
Patient still running low-grade fever.  He is not hypotensive or tachycardic anymore.  He has been drinking a lot of fluids and eating.  He looks well.  Chest x-ray reviewed by me looks okay.  We will check his urine as well and continue to monitor him.   Nena Polio, MD 11/16/18 [phone]

## 2018-11-16 NOTE — Consult Note (Signed)
[facility] Face-to-Face Psychiatry Consult   Reason for Consult: Decompensated schizophrenia. Referring Physician: Dr. Mayford Knife Patient Identification: Randy Ferguson MRN:  [id number] Principal Diagnosis: Schizophrenia [facility]) Diagnosis:  Principal Problem:   Schizophrenia (HCC) Active Problems:   Tardive dyskinesia  Patient is seen, chart is reviewed Total Time spent with patient: 1 hour  Subjective: Patient mumbles with audible words, church and mercy."  HPI:  Randy Ferguson is a 62 y.o. male patient who presents to the ER via law enforcement after he was found at a local gas station attempting to purchase a lottery ticket, only having on an adult diaper.  Per Group Home Nurse, after the patient discharged from the [facility], he's started wondering from the Group Home. They believe it is due to his "Clozaril" getting discontinued. Group Home Nurse states they are familiar with his psychosis and able to manage them. However, the wandering from the Group Home is new and they are unable to manage it. Patient was difficult to engage. His speech was mumbled and difficult to understand.  Record review reveals patient hospitalized from May 13 through Oct 26, 2018 due to agitated behaviors in his group home and threatening to set fires.  During this hospitalization, patient was communicative with clear speech, although a disorganized thought process.  Medication changes and follow-up from that hospitalization as below:  Medication List       STOP taking these medications      benztropine 2 MG tablet Commonly known as:  COGENTIN   cloZAPine 100 MG tablet Commonly known as:  CLOZARIL           TAKE these medications     Indication  Abilify Maintena 400 MG Srer injection Generic drug:  ARIPiprazole ER Inject 400 mg into the muscle every 28 (twenty-eight) days.  Indication:  Schizophrenia   clonazePAM 1 MG tablet Commonly known as:  KLONOPIN Take 1 tablet (1 mg total) by mouth 3  (three) times daily. What changed:    when to take this  Another medication with the same name was removed. Continue taking this medication, and follow the directions you see here.  Indication:  Schizophrenia   divalproex 500 MG DR tablet Commonly known as:  DEPAKOTE Take 500 mg by mouth 3 (three) times daily.  Indication:  Schizophrenia   docusate sodium 100 MG capsule Commonly known as:  COLACE Take 100 mg by mouth 2 (two) times daily.  Indication:  Constipation   finasteride 5 MG tablet Commonly known as:  PROSCAR Take 5 mg by mouth daily.  Indication:  Benign Enlargement of Prostate   folic acid 1 MG tablet Commonly known as:  FOLVITE Take 1 mg by mouth daily.  Indication:  Anemia From Inadequate Folic Acid   levothyroxine 409 MCG tablet Commonly known as:  SYNTHROID Take 1 tablet (200 mcg total) by mouth daily before breakfast. What changed:    medication strength  how much to take  Indication:  Underactive Thyroid   lisinopril-hydrochlorothiazide 10-12.5 MG tablet Commonly known as:  ZESTORETIC Take 1 tablet by mouth daily.  Indication:  High Blood Pressure Disorder   Melatonin 5 MG Tabs Take 1 tablet by mouth at bedtime.  Indication:  Trouble Sleeping   omeprazole 20 MG capsule Commonly known as:  PRILOSEC Take 20 mg by mouth daily.  Indication:  Gastroesophageal Reflux Disease   oxybutynin 5 MG tablet Commonly known as:  DITROPAN Take 5 mg by mouth 2 (two) times daily.  Indication:  Bedwetting   QUEtiapine 100  MG tablet Commonly known as:  SEROQUEL Take 1 tablet (100 mg total) by mouth at bedtime.  Indication:  Schizophrenia   tamsulosin 0.4 MG Caps capsule Commonly known as:  FLOMAX Take 0.4 mg by mouth daily.  Indication:  Chronic Prostate Gland Inflammation   traZODone 150 MG tablet Commonly known as:  DESYREL Take 1 tablet (150 mg total) by mouth at bedtime.  Indication:  Trouble Sleeping   vitamin B-12 1000  MCG tablet Commonly known as:  CYANOCOBALAMIN Take 1,000 mcg by mouth as directed. Take one tablet on Monday, weds, fri and sat  Indication:  Inadequate Vitamin B12         Follow-up Information    [facility] Follow up.   Why:  Please attend appointment 10/28/2018 at 12:30.  This is a hospital discharge appointment to assess for earlier medication appointment with psychiatrist Contact information: [address] [place] [place] [address] [phone]   On evaluation today, patient is pleasant, however mumbling nonsensically.  He has disorganized thought, however when recognizes a staff member he knows he brightens with excitable speech which has hyper religious associations.  Patient is unable to answer questions regarding suicidal homicidal ideation.  Patient is clearly responding to internal stimuli.  Labs are reviewed: No evidence of urinary tract infection, COVID 19 screening is negative; no evidence of alcohol or drug use; CBC and CMP relatively unremarkable.  Past Psychiatric History: Schizophrenia  Risk to Self: Suicidal Ideation: No Suicidal Intent: No Is patient at risk for suicide?: No Suicidal Plan?: No Access to Means: No What has been your use of drugs/alcohol within the last 12 months?: None Reports How many times?: 0 Other Self Harm Risks: Reports of none Triggers for Past Attempts: None known Intentional Self Injurious Behavior: NoneYes Risk to Others: Homicidal Ideation: No Thoughts of Harm to Others: No Current Homicidal Intent: No Current Homicidal Plan: No Access to Homicidal Means: No Identified Victim: Reports of none History of harm to others?: No Assessment of Violence: None Noted Violent Behavior Description: Reports of none Does patient have access to weapons?: No Criminal Charges Pending?: No Does patient have a court date: NoYes Prior Inpatient Therapy: Prior Inpatient Therapy: Yes Prior Therapy Dates: 02/2013, 12/2012,  10/2018 & 12/2012 Prior Therapy Facilty/Provider(s): [facility] Reason for Treatment: SchizophreniaYes Prior Outpatient Therapy: Prior Outpatient Therapy: Yes Prior Therapy Dates: Current Prior Therapy Facilty/Provider(s): [facility] Reason for Treatment: Schizophrenia Does patient have an ACCT team?: No Does patient have Intensive In-House Services?  : No Does patient have [facility] services? : No Does patient have P4CC services?: NoYes  Past Medical History:  Past Medical History:  Diagnosis Date  . GERD (gastroesophageal reflux disease)   . Hypertension   . Schizophrenia (HCC)   . Thrombocytopenia (HCC)   . Thyroid disease    History reviewed. No pertinent surgical history. Family History: History reviewed. No pertinent family history. Family Psychiatric  History: Unknown Social History:  Social History   Substance and Sexual Activity  Alcohol Use Not Currently     Social History   Substance and Sexual Activity  Drug Use Not Currently    Social History   Socioeconomic History  . Marital status: Unknown    Spouse name: Not on file  . Number of children: Not on file  . Years of education: Not on file  . Highest education level: Not on file  Occupational History  . Not on file  Social Needs  . Financial resource strain: Not on file  .  Food insecurity:    Worry: Not on file    Inability: Not on file  . Transportation needs:    Medical: Not on file    Non-medical: Not on file  Tobacco Use  . Smoking status: Current Every Day Smoker    Packs/day: 0.50    Types: Cigarettes  . Smokeless tobacco: Never Used  Substance and Sexual Activity  . Alcohol use: Not Currently  . Drug use: Not Currently  . Sexual activity: Not on file  Lifestyle  . Physical activity:    Days per week: Not on file    Minutes per session: Not on file  . Stress: Not on file  Relationships  . Social connections:    Talks on phone: Not on file    Gets together: Not on file    Attends religious  service: Not on file    Active member of club or organization: Not on file    Attends meetings of clubs or organizations: Not on file    Relationship status: Not on file  Other Topics Concern  . Not on file  Social History Narrative  . Not on file   Additional Social History:  Lives in group home which she will take patient back once he is psychiatrically stabilized.  Allergies:  No Known Allergies  Labs:  Results for orders placed or performed during the hospital encounter of 11/15/18 (from the past 48 hour(s))  Comprehensive metabolic panel     Status: Abnormal   Collection Time: 11/15/18  4:48 PM  Result Value Ref Range   Sodium 139 135 - 145 mmol/L   Potassium 4.0 3.5 - 5.1 mmol/L   Chloride 105 98 - 111 mmol/L   CO2 27 22 - 32 mmol/L   Glucose, Bld 127 (H) 70 - 99 mg/dL   BUN 18 6 - 20 mg/dL   Creatinine, Ser 1.610.84 0.61 - 1.24 mg/dL   Calcium 9.7 8.9 - 09.610.3 mg/dL   Total Protein 8.1 6.5 - 8.1 g/dL   Albumin 3.6 3.5 - 5.0 g/dL   AST 27 15 - 41 U/L   ALT 24 0 - 44 U/L   Alkaline Phosphatase 75 38 - 126 U/L   Total Bilirubin 0.6 0.3 - 1.2 mg/dL   GFR calc non Af Amer >60 >60 mL/min   GFR calc Af Amer >60 >60 mL/min   Anion gap 7 5 - 15    Comment: Performed at [facility], [address] [place], [place] [address]  Ethanol     Status: None   Collection Time: 11/15/18  4:48 PM  Result Value Ref Range   Alcohol, Ethyl (B) <10 <10 mg/dL    Comment: (NOTE) Lowest detectable limit for serum alcohol is 10 mg/dL. For medical purposes only. Performed at [facility], [address] [place], [place] [address]   Salicylate level     Status: None   Collection Time: 11/15/18  4:48 PM  Result Value Ref Range   Salicylate Lvl <7.0 2.8 - 30.0 mg/dL    Comment: Performed at [facility], [address] [place], [place] [address]  Acetaminophen level     Status: Abnormal   Collection Time: 11/15/18  4:48 PM  Result Value Ref Range    Acetaminophen (Tylenol), Serum <10 (L) 10 - 30 ug/mL    Comment: (NOTE) Therapeutic concentrations vary significantly. A range of 10-30 ug/mL  may be an effective concentration for many patients. However, some  are best treated at  concentrations outside of this range. Acetaminophen concentrations >150 ug/mL at 4 hours after ingestion  and >50 ug/mL at 12 hours after ingestion are often associated with  toxic reactions. Performed at [facility], [address] [place], [place] [address]   cbc     Status: Abnormal   Collection Time: 11/15/18  4:48 PM  Result Value Ref Range   WBC 4.4 4.0 - 10.5 K/uL   RBC 3.98 (L) 4.22 - 5.81 MIL/uL   Hemoglobin 12.8 (L) 13.0 - 17.0 g/dL   HCT 19.139.1 47.839.0 - 29.552.0 %   MCV 98.2 80.0 - 100.0 fL   MCH 32.2 26.0 - 34.0 pg   MCHC 32.7 30.0 - 36.0 g/dL   RDW 62.113.8 30.811.5 - 65.715.5 %   Platelets 106 (L) 150 - 400 K/uL    Comment: Immature Platelet Fraction may be clinically indicated, consider ordering this additional test [id number]    nRBC 0.0 0.0 - 0.2 %    Comment: Performed at [facility], [address] [place], [place] [address]  Urine Drug Screen, Qualitative     Status: Abnormal   Collection Time: 11/15/18  4:48 PM  Result Value Ref Range   Tricyclic, Ur Screen POSITIVE (A) NONE DETECTED   Amphetamines, Ur Screen NONE DETECTED NONE DETECTED   MDMA (Ecstasy)Ur Screen NONE DETECTED NONE DETECTED   Cocaine Metabolite,Ur [place] NONE DETECTED NONE DETECTED   Opiate, Ur Screen NONE DETECTED NONE DETECTED   Phencyclidine (PCP) Ur S NONE DETECTED NONE DETECTED   Cannabinoid 50 Ng, Ur [place] NONE DETECTED NONE DETECTED   Barbiturates, Ur Screen NONE DETECTED NONE DETECTED   Benzodiazepine, Ur Scrn POSITIVE (A) NONE DETECTED   Methadone Scn, Ur NONE DETECTED NONE DETECTED    Comment: (NOTE) Tricyclics + metabolites, urine    Cutoff 1000 ng/mL Amphetamines + metabolites, urine  Cutoff 1000 ng/mL MDMA (Ecstasy), urine              Cutoff 500  ng/mL Cocaine Metabolite, urine          Cutoff 300 ng/mL Opiate + metabolites, urine        Cutoff 300 ng/mL Phencyclidine (PCP), urine         Cutoff 25 ng/mL Cannabinoid, urine                 Cutoff 50 ng/mL Barbiturates + metabolites, urine  Cutoff 200 ng/mL Benzodiazepine, urine              Cutoff 200 ng/mL Methadone, urine                   Cutoff 300 ng/mL The urine drug screen provides only a preliminary, unconfirmed analytical test result and should not be used for non-medical purposes. Clinical consideration and professional judgment should be applied to any positive drug screen result due to possible interfering substances. A more specific alternate chemical method must be used in order to obtain a confirmed analytical result. Gas chromatography / mass spectrometry (GC/MS) is the preferred confirmat ory method. Performed at [facility], [address] [place], [place] [address]   CBC with Differential/Platelet     Status: Abnormal   Collection Time: 11/15/18  4:48 PM  Result Value Ref Range   WBC 4.2 4.0 - 10.5 K/uL   RBC 3.95 (L) 4.22 - 5.81 MIL/uL   Hemoglobin 12.8 (L) 13.0 - 17.0 g/dL   HCT 02.738.9 (L) 25.339.0 - 66.452.0 %   MCV 98.5 80.0 - 100.0  fL   MCH 32.4 26.0 - 34.0 pg   MCHC 32.9 30.0 - 36.0 g/dL   RDW 16.113.9 09.611.5 - 04.515.5 %   Platelets 106 (L) 150 - 400 K/uL    Comment: Immature Platelet Fraction may be clinically indicated, consider ordering this additional test [id number]    nRBC 0.0 0.0 - 0.2 %   Neutrophils Relative % 51 %   Neutro Abs 2.1 1.7 - 7.7 K/uL   Lymphocytes Relative 42 %   Lymphs Abs 1.8 0.7 - 4.0 K/uL   Monocytes Relative 7 %   Monocytes Absolute 0.3 0.1 - 1.0 K/uL   Eosinophils Relative 0 %   Eosinophils Absolute 0.0 0.0 - 0.5 K/uL   Basophils Relative 0 %   Basophils Absolute 0.0 0.0 - 0.1 K/uL   Immature Granulocytes 0 %   Abs Immature Granulocytes 0.01 0.00 - 0.07 K/uL    Comment: Performed at [facility], [address] [place], [place] [address]  Urinalysis, Complete w Microscopic     Status: Abnormal   Collection Time: 11/15/18  7:20 PM  Result Value Ref Range   Color, Urine YELLOW (A) YELLOW   APPearance CLEAR (A) CLEAR   Specific Gravity, Urine 1.020 1.005 - 1.030   pH 5.0 5.0 - 8.0   Glucose, UA NEGATIVE NEGATIVE mg/dL   Hgb urine dipstick NEGATIVE NEGATIVE   Bilirubin Urine NEGATIVE NEGATIVE   Ketones, ur NEGATIVE NEGATIVE mg/dL   Protein, ur NEGATIVE NEGATIVE mg/dL   Nitrite NEGATIVE NEGATIVE   Leukocytes,Ua NEGATIVE NEGATIVE   WBC, UA NONE SEEN 0 - 5 WBC/hpf   Bacteria, UA NONE SEEN NONE SEEN   Squamous Epithelial / LPF NONE SEEN 0 - 5   Mucus PRESENT     Comment: Performed at [facility], [address] [place], [place] [address]  SARS Coronavirus 2 (CEPHEID - Performed in [facility]), Hosp Order     Status: None   Collection Time: 11/16/18  4:23 AM  Result Value Ref Range   SARS Coronavirus 2 NEGATIVE NEGATIVE    Comment: (NOTE) If result is NEGATIVE SARS-CoV-2 target nucleic acids are NOT DETECTED. The SARS-CoV-2 RNA is generally detectable in upper and lower  respiratory specimens during the acute phase of infection. The lowest  concentration of SARS-CoV-2 viral copies this assay can detect is 250  copies / mL. A negative result does not preclude SARS-CoV-2 infection  and should not be used as the sole basis for treatment or other  patient management decisions.  A negative result may occur with  improper specimen collection / handling, submission of specimen other  than nasopharyngeal swab, presence of viral mutation(s) within the  areas targeted by this assay, and inadequate number of viral copies  (<250 copies / mL). A negative result must be combined with clinical  observations, patient history, and epidemiological information. If result is POSITIVE SARS-CoV-2 target nucleic acids are DETECTED. The SARS-CoV-2 RNA is generally detectable in  upper and lower  respiratory specimens dur ing the acute phase of infection.  Positive  results are indicative of active infection with SARS-CoV-2.  Clinical  correlation with patient history and other diagnostic information is  necessary to determine patient infection status.  Positive results do  not rule out bacterial infection or co-infection with other viruses. If result is PRESUMPTIVE POSTIVE SARS-CoV-2 nucleic acids MAY BE PRESENT.   A presumptive positive result was obtained on the submitted specimen  and confirmed on repeat testing.  While 2019 novel coronavirus  (SARS-CoV-2) nucleic acids may be present in the submitted sample  additional confirmatory testing may be necessary for epidemiological  and / or clinical management purposes  to differentiate between  SARS-CoV-2 and other Sarbecovirus currently known to infect humans.  If clinically indicated additional testing with an alternate test  methodology [phone]) is advised. The SARS-CoV-2 RNA is generally  detectable in upper and lower respiratory sp ecimens during the acute  phase of infection. The expected result is Negative. Fact Sheet for Patients:  BoilerBrush.com.cy Fact Sheet for Healthcare Providers: https://pope.com/ This test is not yet approved or cleared by the [place] FDA and has been authorized for detection and/or diagnosis of SARS-CoV-2 by FDA under an Emergency Use Authorization (EUA).  This EUA will remain in effect (meaning this test can be used) for the duration of the COVID-19 declaration under Section 564(b)(1) of the Act, 21 U.S.C. section 360bbb-3(b)(1), unless the authorization is terminated or revoked sooner. Performed at [facility], [address] [place], [place] [address]     No current facility-administered medications for this encounter.    Current Outpatient Medications  Medication Sig Dispense Refill  . ARIPiprazole  ER (ABILIFY MAINTENA) 400 MG SRER injection Inject 400 mg into the muscle every 28 (twenty-eight) days.    . clonazePAM (KLONOPIN) 1 MG tablet Take 1 tablet (1 mg total) by mouth 3 (three) times daily. 90 tablet 0  . divalproex (DEPAKOTE) 500 MG DR tablet Take 500 mg by mouth 3 (three) times daily.    Marland Kitchen docusate sodium (COLACE) 100 MG capsule Take 100 mg by mouth 2 (two) times daily.    . finasteride (PROSCAR) 5 MG tablet Take 5 mg by mouth daily.    . folic acid (FOLVITE) 1 MG tablet Take 1 mg by mouth daily.    Marland Kitchen levothyroxine (SYNTHROID) 200 MCG tablet Take 1 tablet (200 mcg total) by mouth daily before breakfast. 30 tablet 0  . lisinopril-hydrochlorothiazide (ZESTORETIC) 10-12.5 MG tablet Take 1 tablet by mouth daily. 30 tablet 0  . Melatonin 5 MG TABS Take 1 tablet by mouth at bedtime.    Marland Kitchen omeprazole (PRILOSEC) 20 MG capsule Take 20 mg by mouth daily.    Marland Kitchen oxybutynin (DITROPAN) 5 MG tablet Take 5 mg by mouth 2 (two) times daily.    . QUEtiapine (SEROQUEL) 100 MG tablet Take 1 tablet (100 mg total) by mouth at bedtime. 30 tablet 1  . tamsulosin (FLOMAX) 0.4 MG CAPS capsule Take 0.4 mg by mouth daily.    . traZODone (DESYREL) 150 MG tablet Take 1 tablet (150 mg total) by mouth at bedtime. 30 tablet 0  . vitamin B-12 (CYANOCOBALAMIN) 1000 MCG tablet Take 1,000 mcg by mouth as directed. Take one tablet on Monday, weds, fri and sat      Musculoskeletal: Strength & Muscle Tone: within normal limits Gait & Station: normal Patient leans: N/A  Psychiatric Specialty Exam: Physical Exam  Nursing note and vitals reviewed. Constitutional: He is oriented to person, place, and time. He appears well-developed and well-nourished.  HENT:  Head: Normocephalic and atraumatic.  Eyes: Pupils are equal, round, and reactive to light. Conjunctivae and EOM are normal.  Neck: Normal range of motion. Neck supple.  Cardiovascular: Normal rate and regular rhythm.  Respiratory: Effort normal and breath sounds  normal.  Musculoskeletal: Normal range of motion.  Neurological: He is alert and oriented to person, place, and time. He has normal reflexes.  Skin: Skin is warm and  dry.    Review of Systems  Unable to perform ROS: Psychiatric disorder  Psychiatric/Behavioral: Positive for hallucinations. Negative for substance abuse. The patient is nervous/anxious.   By exam  Blood pressure 109/85, pulse 77, temperature 99.1 F (37.3 C), temperature source Oral, resp. rate 18, height 5\' 9"  (1.753 m), weight 77.1 kg, SpO2 99 %.Body mass index is 25.1 kg/m.  General Appearance: Fairly Groomed  Eye Contact:  Fair  Speech:  Garbled  Volume:  Decreased  Mood:  Anxious and Euphoric, fluctuates  Affect:  Constricted  Thought Process:  Disorganized and Irrelevant  Orientation:  Other:  to self   Thought Content:  Hallucinations: Auditory and Paranoid Ideation  Suicidal Thoughts:  No  Homicidal Thoughts:  No  Memory:  NA  Judgement:  Impaired  Insight:  Lacking  Psychomotor Activity:  Normal  Concentration:  Concentration: Poor  Recall:  Poor  Fund of Knowledge:  Poor  Language:  Poor  Akathisia:  NA  Handed:  Right  AIMS (if indicated):     Assets:  Desire for Improvement Social Support  ADL's:  Intact  Cognition:  Impaired,  Moderate  Sleep:        Treatment Plan Summary: Daily contact with patient to assess and evaluate symptoms and progress in treatment and Medication management  Restart home medications Klonopin 1 mg 3 times a day; Depakote 500 mg 3 times a day; Colace 100 mg twice daily; Proscar 5 mg daily; folic acid 1 mg daily; Synthroid 200 mcg daily; Zestoretic 10/12.5 daily; melatonin 5 mg at bedtime; Nicorette gum 2 mg every 4 hours while awake; Ditropan 5 mg twice daily; Protonix 40 mg daily; Seroquel 100 mg daily at bedtime; Flomax 0.4 mg daily; trazodone 150 mg daily at bedtime; vitamin B12 1000 mcg every Monday Wednesday Friday.  Disposition: Recommend psychiatric Inpatient  admission when medically cleared. Supportive therapy provided about ongoing stressors. Recommend geriatric psychiatry unit  Lavella Hammock, MD 11/16/2018 10:12 AM

## 2018-11-16 NOTE — ED Notes (Signed)
IVC/Consult completed/ Pending placement 

## 2018-11-16 NOTE — ED Notes (Signed)
Gave report to Amy T. RN regarding patient transferring to room 3 in [place], escorted per police and CNA.

## 2018-11-16 NOTE — ED Notes (Signed)
ED BHU PLACEMENT  Is the patient under IVC or is there intent for IVC: Yes.   Is the patient medically cleared: Yes.   Is there vacancy in the ED BHU: Yes.   Is the population mix appropriate for patient: Yes.   Is the patient awaiting placement in inpatient or outpatient setting:  Has the patient had a psychiatric consult:  Reassessment pending Survey of unit performed for contraband, proper placement and condition of furniture, tampering with fixtures in bathroom, shower, and each patient room: Yes.  ; Findings:  APPEARANCE/BEHAVIOR Calm and cooperative NEURO ASSESSMENT Orientation: oriented x3  Denies pain Hallucinations: No.None noted (Hallucinations)  Denies at this time Speech: Normal Gait: normal RESPIRATORY ASSESSMENT Even  Unlabored respirations  CARDIOVASCULAR ASSESSMENT Pulses equal   regular rate  Skin warm and dry   GASTROINTESTINAL ASSESSMENT no GI complaint EXTREMITIES Full ROM  PLAN OF CARE Provide calm/safe environment. Vital signs assessed twice daily. ED BHU Assessment once each 12-hour shift. Collaborate with TTS daily or as condition indicates. Assure the ED provider has rounded once each shift. Provide and encourage hygiene. Provide redirection as needed. Assess for escalating behavior; address immediately and inform ED provider.  Assess family dynamic and appropriateness for visitation as needed: Yes.  ; If necessary, describe findings:  Educate the patient/family about BHU procedures/visitation: Yes.  ; If necessary, describe findings:

## 2018-11-17 DIAGNOSIS — F209 Schizophrenia, unspecified: Secondary | ICD-10-CM | POA: Diagnosis not present

## 2018-11-17 DIAGNOSIS — F201 Disorganized schizophrenia: Secondary | ICD-10-CM | POA: Diagnosis not present

## 2018-11-17 DIAGNOSIS — G2401 Drug induced subacute dyskinesia: Secondary | ICD-10-CM | POA: Diagnosis not present

## 2018-11-17 MED ORDER — QUETIAPINE FUMARATE 25 MG PO TABS
25.0000 mg | ORAL_TABLET | Freq: Three times a day (TID) | ORAL | Status: DC | PRN
  Administered 2018-11-18: 25 mg via ORAL
  Filled 2018-11-17: qty 1

## 2018-11-17 MED ORDER — QUETIAPINE FUMARATE 200 MG PO TABS
200.0000 mg | ORAL_TABLET | Freq: Every day | ORAL | Status: DC
  Administered 2018-11-17: 22:00:00 200 mg via ORAL
  Filled 2018-11-17: qty 1

## 2018-11-17 NOTE — ED Notes (Signed)
IVC/  PENDING  PLACEMENT 

## 2018-11-17 NOTE — ED Notes (Signed)
Pt given breakfast tray

## 2018-11-17 NOTE — Consult Note (Signed)
[facility] Face-to-Face Psychiatry Consult Follow-Up  Reason for Consult: Decompensated schizophrenia. Referring Physician: Dr. Jimmye Norman Patient Identification: Randy Ferguson MRN:  [id number] Principal Diagnosis: Schizophrenia [facility]) Diagnosis:  Principal Problem:   Schizophrenia ([place]) Active Problems:   Tardive dyskinesia  Patient is seen, chart is reviewed.  Reviewed labs: At last visit, patient's platelets ranged from 74-85.  Since Clozaril has been discontinued patient platelet level was 106 on arrival to the emergency department.  Total Time spent with patient: 45 minutes  Subjective: "I want my stronger medicine and to live on my own."  HPI:  Randy Ferguson is a 63 y.o. male patient who presents to the ER via law enforcement after he was found at a local gas station attempting to purchase a lottery ticket, only having on an adult diaper.  Per Group Home Nurse, after the patient discharged from the [facility] BMU, he's started wondering from the [place]. They believe it is due to his "Clozaril" getting discontinued. Group Home Nurse states they are familiar with his psychosis and able to manage them. However, the wandering from the [place] is new and they are unable to manage it. Patient was difficult to engage. His speech was mumbled and difficult to understand.  Record review reveals patient hospitalized from May 13 through Oct 26, 2018 due to agitated behaviors in his group home and threatening to set fires.  During this hospitalization, patient was communicative with clear speech, although a disorganized thought process.  Medication changes and follow-up from that hospitalization as below:  Medication List       STOP taking these medications      benztropine 2 MG tablet Commonly known as:  COGENTIN   cloZAPine 100 MG tablet Commonly known as:  CLOZARIL           TAKE these medications     Indication  Abilify Maintena 400 MG Srer injection Generic drug:  ARIPiprazole  ER Inject 400 mg into the muscle every 28 (twenty-eight) days.  Indication:  Schizophrenia   clonazePAM 1 MG tablet Commonly known as:  KLONOPIN Take 1 tablet (1 mg total) by mouth 3 (three) times daily. What changed:    when to take this  Another medication with the same name was removed. Continue taking this medication, and follow the directions you see here.  Indication:  Schizophrenia   divalproex 500 MG DR tablet Commonly known as:  DEPAKOTE Take 500 mg by mouth 3 (three) times daily.  Indication:  Schizophrenia   docusate sodium 100 MG capsule Commonly known as:  COLACE Take 100 mg by mouth 2 (two) times daily.  Indication:  Constipation   finasteride 5 MG tablet Commonly known as:  PROSCAR Take 5 mg by mouth daily.  Indication:  Benign Enlargement of Prostate   folic acid 1 MG tablet Commonly known as:  FOLVITE Take 1 mg by mouth daily.  Indication:  Anemia From Inadequate Folic Acid   levothyroxine 200 MCG tablet Commonly known as:  SYNTHROID Take 1 tablet (200 mcg total) by mouth daily before breakfast. What changed:    medication strength  how much to take  Indication:  Underactive Thyroid   lisinopril-hydrochlorothiazide 10-12.5 MG tablet Commonly known as:  ZESTORETIC Take 1 tablet by mouth daily.  Indication:  High Blood Pressure Disorder   Melatonin 5 MG Tabs Take 1 tablet by mouth at bedtime.  Indication:  Trouble Sleeping   omeprazole 20 MG capsule Commonly known as:  PRILOSEC Take 20 mg by mouth daily.  Indication:  Gastroesophageal Reflux Disease   oxybutynin 5 MG tablet Commonly known as:  DITROPAN Take 5 mg by mouth 2 (two) times daily.  Indication:  Bedwetting   QUEtiapine 100 MG tablet Commonly known as:  SEROQUEL Take 1 tablet (100 mg total) by mouth at bedtime.  Indication:  Schizophrenia   tamsulosin 0.4 MG Caps capsule Commonly known as:  FLOMAX Take 0.4 mg by mouth daily.  Indication:  Chronic  Prostate Gland Inflammation   traZODone 150 MG tablet Commonly known as:  DESYREL Take 1 tablet (150 mg total) by mouth at bedtime.  Indication:  Trouble Sleeping   vitamin B-12 1000 MCG tablet Commonly known as:  CYANOCOBALAMIN Take 1,000 mcg by mouth as directed. Take one tablet on Monday, weds, fri and sat  Indication:  Inadequate Vitamin B12         Follow-up Information    [facility] Follow up.   Why:  Please attend appointment 10/28/2018 at 12:30.  This is a hospital discharge appointment to assess for earlier medication appointment with psychiatrist Contact information: [address] [place] [address] [phone]    On evaluation today, patient is pleasant.  He reports he slept well, and has eaten breakfast this morning.  He continues to have some tangential thought processes with some religious content. His voice is clear and coherent at a regular rate.  He states he is his own guardian, and would prefer to live on his own.  He is denying suicidal and homicidal ideation.  He is denying auditory and visual hallucinations.  Patient believes that he has troubles with his heart.  Review of records reveals no recent echocardiogram to show heart failure.  It appears Clozaril was stopped due to leukopenia.  Per nursing report: Patient has been labile.  He is calm and cooperative, but then will become agitated and raises his voice.  He has not attempted to harm anyone.  He has denied SI/HI.  Labs are reviewed: No evidence of urinary tract infection, COVID 19 screening is negative; no evidence of alcohol or drug use; CBC and CMP relatively unremarkable.  Past Psychiatric History: Schizophrenia  Risk to Self: Suicidal Ideation: No Suicidal Intent: No Is patient at risk for suicide?: No Suicidal Plan?: No Access to Means: No What has been your use of drugs/alcohol within the last 12 months?: None Reports How many times?: 0 Other Self Harm Risks:  Reports of none Triggers for Past Attempts: None known Intentional Self Injurious Behavior: NoneYes Risk to Others: Homicidal Ideation: No Thoughts of Harm to Others: No Current Homicidal Intent: No Current Homicidal Plan: No Access to Homicidal Means: No Identified Victim: Reports of none History of harm to others?: No Assessment of Violence: None Noted Violent Behavior Description: Reports of none Does patient have access to weapons?: No Criminal Charges Pending?: No Does patient have a court date: NoYes Prior Inpatient Therapy: Prior Inpatient Therapy: Yes Prior Therapy Dates: 02/2013, 12/2012, 10/2018 & 12/2012 Prior Therapy Facilty/Provider(s): [facility] Reason for Treatment: SchizophreniaYes Prior Outpatient Therapy: Prior Outpatient Therapy: Yes Prior Therapy Dates: Current Prior Therapy Facilty/Provider(s): [facility] Reason for Treatment: Schizophrenia Does patient have an ACCT team?: No Does patient have Intensive In-House Services?  : No Does patient have [facility] services? : No Does patient have P4CC services?: NoYes  Past Medical History:  Past Medical History:  Diagnosis Date  . GERD (gastroesophageal reflux disease)   . Hypertension   . Schizophrenia (HCC)   . Thrombocytopenia (HCC)   . Thyroid  disease    History reviewed. No pertinent surgical history. Family History: History reviewed. No pertinent family history. Family Psychiatric  History: Unknown Social History:  Social History   Substance and Sexual Activity  Alcohol Use Not Currently     Social History   Substance and Sexual Activity  Drug Use Not Currently    Social History   Socioeconomic History  . Marital status: Unknown    Spouse name: Not on file  . Number of children: Not on file  . Years of education: Not on file  . Highest education level: Not on file  Occupational History  . Not on file  Social Needs  . Financial resource strain: Not on file  . Food insecurity:    Worry: Not on  file    Inability: Not on file  . Transportation needs:    Medical: Not on file    Non-medical: Not on file  Tobacco Use  . Smoking status: Current Every Day Smoker    Packs/day: 0.50    Types: Cigarettes  . Smokeless tobacco: Never Used  Substance and Sexual Activity  . Alcohol use: Not Currently  . Drug use: Not Currently  . Sexual activity: Not on file  Lifestyle  . Physical activity:    Days per week: Not on file    Minutes per session: Not on file  . Stress: Not on file  Relationships  . Social connections:    Talks on phone: Not on file    Gets together: Not on file    Attends religious service: Not on file    Active member of club or organization: Not on file    Attends meetings of clubs or organizations: Not on file    Relationship status: Not on file  Other Topics Concern  . Not on file  Social History Narrative  . Not on file   Additional Social History:  Lives in group home which she will take patient back once he is psychiatrically stabilized.  Allergies:  No Known Allergies  Labs:  Results for orders placed or performed during the hospital encounter of 11/15/18 (from the past 48 hour(s))  Comprehensive metabolic panel     Status: Abnormal   Collection Time: 11/15/18  4:48 PM  Result Value Ref Range   Sodium 139 135 - 145 mmol/L   Potassium 4.0 3.5 - 5.1 mmol/L   Chloride 105 98 - 111 mmol/L   CO2 27 22 - 32 mmol/L   Glucose, Bld 127 (H) 70 - 99 mg/dL   BUN 18 6 - 20 mg/dL   Creatinine, Ser 1.61 0.61 - 1.24 mg/dL   Calcium 9.7 8.9 - 09.6 mg/dL   Total Protein 8.1 6.5 - 8.1 g/dL   Albumin 3.6 3.5 - 5.0 g/dL   AST 27 15 - 41 U/L   ALT 24 0 - 44 U/L   Alkaline Phosphatase 75 38 - 126 U/L   Total Bilirubin 0.6 0.3 - 1.2 mg/dL   GFR calc non Af Amer >60 >60 mL/min   GFR calc Af Amer >60 >60 mL/min   Anion gap 7 5 - 15    Comment: Performed at [facility], [address], [place], [place] [address]  Ethanol     Status: None   Collection  Time: 11/15/18  4:48 PM  Result Value Ref Range   Alcohol, Ethyl (B) <10 <10 mg/dL    Comment: (NOTE) Lowest detectable limit for serum alcohol is 10 mg/dL. For medical purposes only.  Performed at [facility], [address] [place], [place] [address]   Salicylate level     Status: None   Collection Time: 11/15/18  4:48 PM  Result Value Ref Range   Salicylate Lvl <7.0 2.8 - 30.0 mg/dL    Comment: Performed at [facility], [address] [place], [place] [address]  Acetaminophen level     Status: Abnormal   Collection Time: 11/15/18  4:48 PM  Result Value Ref Range   Acetaminophen (Tylenol), Serum <10 (L) 10 - 30 ug/mL    Comment: (NOTE) Therapeutic concentrations vary significantly. A range of 10-30 ug/mL  may be an effective concentration for many patients. However, some  are best treated at concentrations outside of this range. Acetaminophen concentrations >150 ug/mL at 4 hours after ingestion  and >50 ug/mL at 12 hours after ingestion are often associated with  toxic reactions. Performed at [facility], [address] [place], [place] [address]   cbc     Status: Abnormal   Collection Time: 11/15/18  4:48 PM  Result Value Ref Range   WBC 4.4 4.0 - 10.5 K/uL   RBC 3.98 (L) 4.22 - 5.81 MIL/uL   Hemoglobin 12.8 (L) 13.0 - 17.0 g/dL   HCT 91.439.1 78.239.0 - 95.652.0 %   MCV 98.2 80.0 - 100.0 fL   MCH 32.2 26.0 - 34.0 pg   MCHC 32.7 30.0 - 36.0 g/dL   RDW 21.313.8 08.611.5 - 57.815.5 %   Platelets 106 (L) 150 - 400 K/uL    Comment: Immature Platelet Fraction may be clinically indicated, consider ordering this additional test [id number]    nRBC 0.0 0.0 - 0.2 %    Comment: Performed at [facility], [address] [place], [place] [address]  Urine Drug Screen, Qualitative     Status: Abnormal   Collection Time: 11/15/18  4:48 PM  Result Value Ref Range   Tricyclic, Ur Screen POSITIVE (A) NONE DETECTED   Amphetamines, Ur Screen NONE DETECTED NONE  DETECTED   MDMA (Ecstasy)Ur Screen NONE DETECTED NONE DETECTED   Cocaine Metabolite,Ur [place] NONE DETECTED NONE DETECTED   Opiate, Ur Screen NONE DETECTED NONE DETECTED   Phencyclidine (PCP) Ur S NONE DETECTED NONE DETECTED   Cannabinoid 50 Ng, Ur [place] NONE DETECTED NONE DETECTED   Barbiturates, Ur Screen NONE DETECTED NONE DETECTED   Benzodiazepine, Ur Scrn POSITIVE (A) NONE DETECTED   Methadone Scn, Ur NONE DETECTED NONE DETECTED    Comment: (NOTE) Tricyclics + metabolites, urine    Cutoff 1000 ng/mL Amphetamines + metabolites, urine  Cutoff 1000 ng/mL MDMA (Ecstasy), urine              Cutoff 500 ng/mL Cocaine Metabolite, urine          Cutoff 300 ng/mL Opiate + metabolites, urine        Cutoff 300 ng/mL Phencyclidine (PCP), urine         Cutoff 25 ng/mL Cannabinoid, urine                 Cutoff 50 ng/mL Barbiturates + metabolites, urine  Cutoff 200 ng/mL Benzodiazepine, urine              Cutoff 200 ng/mL Methadone, urine                   Cutoff 300 ng/mL The urine drug screen provides only a preliminary, unconfirmed analytical test result and should not be used for non-medical purposes. Clinical consideration and  professional judgment should be applied to any positive drug screen result due to possible interfering substances. A more specific alternate chemical method must be used in order to obtain a confirmed analytical result. Gas chromatography / mass spectrometry (GC/MS) is the preferred confirmat ory method. Performed at [facility], [address] [place], [place] [address]   CBC with Differential/Platelet     Status: Abnormal   Collection Time: 11/15/18  4:48 PM  Result Value Ref Range   WBC 4.2 4.0 - 10.5 K/uL   RBC 3.95 (L) 4.22 - 5.81 MIL/uL   Hemoglobin 12.8 (L) 13.0 - 17.0 g/dL   HCT 60.438.9 (L) 54.039.0 - 98.152.0 %   MCV 98.5 80.0 - 100.0 fL   MCH 32.4 26.0 - 34.0 pg   MCHC 32.9 30.0 - 36.0 g/dL   RDW 19.113.9 47.811.5 - 29.515.5 %   Platelets 106 (L) 150 - 400 K/uL     Comment: Immature Platelet Fraction may be clinically indicated, consider ordering this additional test [id number]    nRBC 0.0 0.0 - 0.2 %   Neutrophils Relative % 51 %   Neutro Abs 2.1 1.7 - 7.7 K/uL   Lymphocytes Relative 42 %   Lymphs Abs 1.8 0.7 - 4.0 K/uL   Monocytes Relative 7 %   Monocytes Absolute 0.3 0.1 - 1.0 K/uL   Eosinophils Relative 0 %   Eosinophils Absolute 0.0 0.0 - 0.5 K/uL   Basophils Relative 0 %   Basophils Absolute 0.0 0.0 - 0.1 K/uL   Immature Granulocytes 0 %   Abs Immature Granulocytes 0.01 0.00 - 0.07 K/uL    Comment: Performed at [facility], [address] [place], [place] [address]  Urinalysis, Complete w Microscopic     Status: Abnormal   Collection Time: 11/15/18  7:20 PM  Result Value Ref Range   Color, Urine YELLOW (A) YELLOW   APPearance CLEAR (A) CLEAR   Specific Gravity, Urine 1.020 1.005 - 1.030   pH 5.0 5.0 - 8.0   Glucose, UA NEGATIVE NEGATIVE mg/dL   Hgb urine dipstick NEGATIVE NEGATIVE   Bilirubin Urine NEGATIVE NEGATIVE   Ketones, ur NEGATIVE NEGATIVE mg/dL   Protein, ur NEGATIVE NEGATIVE mg/dL   Nitrite NEGATIVE NEGATIVE   Leukocytes,Ua NEGATIVE NEGATIVE   WBC, UA NONE SEEN 0 - 5 WBC/hpf   Bacteria, UA NONE SEEN NONE SEEN   Squamous Epithelial / LPF NONE SEEN 0 - 5   Mucus PRESENT     Comment: Performed at [facility], [address] [place], [place] [address]  Urine culture     Status: None (Preliminary result)   Collection Time: 11/15/18  7:20 PM  Result Value Ref Range   Specimen Description      URINE, RANDOM Performed at [facility], [address] [place], [place] [address]    Special Requests      NONE Performed at [facility], [address] [place], [place] [address]    Culture      CULTURE REINCUBATED FOR BETTER GROWTH Performed at [facility], [address] [place], [place] [address]    Report Status PENDING   SARS Coronavirus 2 (CEPHEID - Performed in  [facility] hospital lab), Hosp Order     Status: None   Collection Time: 11/16/18  4:23 AM  Result Value Ref Range   SARS Coronavirus 2 NEGATIVE NEGATIVE    Comment: (NOTE) If result is NEGATIVE SARS-CoV-2 target nucleic acids are NOT DETECTED.  The SARS-CoV-2 RNA is generally detectable in upper and lower  respiratory specimens during the acute phase of infection. The lowest  concentration of SARS-CoV-2 viral copies this assay can detect is 250  copies / mL. A negative result does not preclude SARS-CoV-2 infection  and should not be used as the sole basis for treatment or other  patient management decisions.  A negative result may occur with  improper specimen collection / handling, submission of specimen other  than nasopharyngeal swab, presence of viral mutation(s) within the  areas targeted by this assay, and inadequate number of viral copies  (<250 copies / mL). A negative result must be combined with clinical  observations, patient history, and epidemiological information. If result is POSITIVE SARS-CoV-2 target nucleic acids are DETECTED. The SARS-CoV-2 RNA is generally detectable in upper and lower  respiratory specimens dur ing the acute phase of infection.  Positive  results are indicative of active infection with SARS-CoV-2.  Clinical  correlation with patient history and other diagnostic information is  necessary to determine patient infection status.  Positive results do  not rule out bacterial infection or co-infection with other viruses. If result is PRESUMPTIVE POSTIVE SARS-CoV-2 nucleic acids MAY BE PRESENT.   A presumptive positive result was obtained on the submitted specimen  and confirmed on repeat testing.  While 2019 novel coronavirus  (SARS-CoV-2) nucleic acids may be present in the submitted sample  additional confirmatory testing may be necessary for epidemiological  and / or clinical management purposes  to differentiate between  SARS-CoV-2 and other  Sarbecovirus currently known to infect humans.  If clinically indicated additional testing with an alternate test  methodology [phone]) is advised. The SARS-CoV-2 RNA is generally  detectable in upper and lower respiratory sp ecimens during the acute  phase of infection. The expected result is Negative. Fact Sheet for Patients:  BoilerBrush.com.cy Fact Sheet for Healthcare Providers: https://pope.com/ This test is not yet approved or cleared by the [place] FDA and has been authorized for detection and/or diagnosis of SARS-CoV-2 by FDA under an Emergency Use Authorization (EUA).  This EUA will remain in effect (meaning this test can be used) for the duration of the COVID-19 declaration under Section 564(b)(1) of the Act, 21 U.S.C. section 360bbb-3(b)(1), unless the authorization is terminated or revoked sooner. Performed at [facility], [address], [place], [place] [address]     Current Facility-Administered Medications  Medication Dose Route Frequency Provider Last Rate Last Dose  . clonazePAM (KLONOPIN) tablet 1 mg  1 mg Oral TID Mariel Craft, MD   1 mg at 11/17/18 0944  . divalproex (DEPAKOTE) DR tablet 500 mg  500 mg Oral TID Mariel Craft, MD   500 mg at 11/17/18 0946  . docusate sodium (COLACE) capsule 100 mg  100 mg Oral BID Mariel Craft, MD   100 mg at 11/17/18 0944  . finasteride (PROSCAR) tablet 5 mg  5 mg Oral Daily Mariel Craft, MD   5 mg at 11/17/18 0944  . folic acid (FOLVITE) tablet 1 mg  1 mg Oral Daily Mariel Craft, MD   1 mg at 11/17/18 0943  . lisinopril (ZESTRIL) tablet 10 mg  10 mg Oral Daily Mariel Craft, MD   10 mg at 11/17/18 8119   And  . hydrochlorothiazide (MICROZIDE) capsule 12.5 mg  12.5 mg Oral Daily Mariel Craft, MD   12.5 mg at 11/17/18 0944  . levothyroxine (SYNTHROID) tablet 200 mcg  200  mcg Oral QAC breakfast Mariel CraftMaurer, Nazaria Riesen M, MD   200 mcg at 11/17/18 0944   . Melatonin TABS 5 mg  1 tablet Oral QHS Mariel CraftMaurer, Gao Mitnick M, MD      . nicotine polacrilex (NICORETTE) gum 2 mg  2 mg Oral Q4H while awake Emily FilbertWilliams, Jonathan E, MD   2 mg at 11/17/18 0946  . oxybutynin (DITROPAN) tablet 5 mg  5 mg Oral BID Mariel CraftMaurer, Vaunda Gutterman M, MD   5 mg at 11/17/18 0944  . pantoprazole (PROTONIX) EC tablet 40 mg  40 mg Oral Daily Mariel CraftMaurer, Lucielle Vokes M, MD   40 mg at 11/17/18 0943  . QUEtiapine (SEROQUEL) tablet 100 mg  100 mg Oral QHS Mariel CraftMaurer, Shalae Belmonte M, MD   100 mg at 11/16/18 2059  . tamsulosin (FLOMAX) capsule 0.4 mg  0.4 mg Oral Daily Mariel CraftMaurer, Harleigh Civello M, MD   0.4 mg at 11/17/18 0943  . traZODone (DESYREL) tablet 150 mg  150 mg Oral QHS Mariel CraftMaurer, Shuan Statzer M, MD   150 mg at 11/16/18 2059  . vitamin B-12 (CYANOCOBALAMIN) tablet 1,000 mcg  1,000 mcg Oral Q M,W,F Mariel CraftMaurer, Kenlea Woodell M, MD   1,000 mcg at 11/16/18 1115   Current Outpatient Medications  Medication Sig Dispense Refill  . ARIPiprazole ER (ABILIFY MAINTENA) 400 MG SRER injection Inject 400 mg into the muscle every 28 (twenty-eight) days.    . clonazePAM (KLONOPIN) 1 MG tablet Take 1 tablet (1 mg total) by mouth 3 (three) times daily. 90 tablet 0  . divalproex (DEPAKOTE) 500 MG DR tablet Take 500 mg by mouth 3 (three) times daily.    Marland Kitchen. docusate sodium (COLACE) 100 MG capsule Take 100 mg by mouth 2 (two) times daily.    . finasteride (PROSCAR) 5 MG tablet Take 5 mg by mouth daily.    . folic acid (FOLVITE) 1 MG tablet Take 1 mg by mouth daily.    Mar[place] Kitchen. levothyroxine (SYNTHROID) 200 MCG tablet Take 1 tablet (200 mcg total) by mouth daily before breakfast. 30 tablet 0  . lisinopril-hydrochlorothiazide (ZESTORETIC) 10-12.5 MG tablet Take 1 tablet by mouth daily. 30 tablet 0  . Melatonin 5 MG TABS Take 1 tablet by mouth at bedtime.    Marland Kitchen. omeprazole (PRILOSEC) 20 MG capsule Take 20 mg by mouth daily.    Marland Kitchen. oxybutynin (DITROPAN) 5 MG tablet Take 5 mg by mouth 2 (two) times daily.    . QUEtiapine (SEROQUEL) 100 MG tablet Take 1 tablet (100 mg total) by  mouth at bedtime. 30 tablet 1  . tamsulosin (FLOMAX) 0.4 MG CAPS capsule Take 0.4 mg by mouth daily.    . traZODone (DESYREL) 150 MG tablet Take 1 tablet (150 mg total) by mouth at bedtime. 30 tablet 0  . vitamin B-12 (CYANOCOBALAMIN) 1000 MCG tablet Take 1,000 mcg by mouth as directed. Take one tablet on Monday, weds, fri and sat      Musculoskeletal: Strength & Muscle Tone: within normal limits Gait & Station: normal Patient leans: N/A  Psychiatric Specialty Exam: Physical Exam  Nursing note and vitals reviewed. Constitutional: He appears well-developed and well-nourished. No distress.  HENT:  Head: Normocephalic and atraumatic.  Eyes: EOM are normal.  Neck: Normal range of motion.  Cardiovascular: Normal rate and regular rhythm.  Respiratory: Effort normal.  Musculoskeletal: Normal range of motion.  Neurological: He is alert.    Review of Systems  Unable to perform ROS: Psychiatric disorder  Psychiatric/Behavioral: Positive for hallucinations. Negative for depression, substance abuse and suicidal ideas. The patient is  nervous/anxious. The patient does not have insomnia.   All other systems reviewed and are negative.   Blood pressure 132/82, pulse 80, temperature 97.9 F (36.6 C), temperature source Oral, resp. rate 20, height  (1.753 m), weight 77.1 kg, SpO2 100 %.Body mass index is 25.1 kg/m.  General Appearance: Fairly Groomed  Eye Contact:  Fair  Speech:  Clear and Coherent and Normal Rate  Volume:  Normal  Mood:  Anxious and Euphoric, fluctuates  Affect:  Constricted  Thought Process:  Descriptions of Associations: Tangential  Orientation:  Other:  to person, place, and situation  Thought Content:  Hallucinations: Denies  Suicidal Thoughts:  No  Homicidal Thoughts:  No  Memory:  fair  Judgement:  Impaired  Insight:  Shallow  Psychomotor Activity:  Normal  Concentration:  Concentration: Fair  Recall:  [employer] of Knowledge:  Fair  Language:  Fair   Akathisia:  No  Handed:  Right  AIMS (if indicated):   + for TD of jaw  Assets:  Communication Skills Desire for Improvement Housing Resilience Social Support  ADL's:  Intact  Cognition:  Impaired,  Mild  Sleep:   adequate   Treatment Plan Summary: Daily contact with patient to assess and evaluate symptoms and progress in treatment and Medication management  Restart home medications Klonopin 1 mg 3 times a day; Depakote 500 mg 3 times a day; Colace 100 mg twice daily; Proscar 5 mg daily; folic acid 1 mg daily; Synthroid 200 mcg daily; Zestoretic 10/12.5 daily; melatonin 5 mg at bedtime; Nicorette gum 2 mg every 4 hours while awake; Ditropan 5 mg twice daily; Protonix 40 mg daily; Seroquel 100 mg daily at bedtime; Flomax 0.4 mg daily; trazodone 150 mg daily at bedtime; vitamin B12 1000 mcg every Monday Wednesday Friday.  11/17/2018: Increase Seroquel to 200 mg daily at bedtime to improve mood stability And Seroquel 25 mg 3 times daily as needed for agitation or hallucinations.  Depakote level added to existing blood work-results pending  Disposition: Recommend psychiatric Inpatient admission when medically cleared. Supportive therapy provided about ongoing stressors. Seek placement at outside hospital, due to [facility] at capacity.    Mariel Craft, MD 11/17/2018 1:15 PM

## 2018-11-17 NOTE — BH Assessment (Signed)
Referral information for Psychiatric Hospitalization faxed to;   [facility] ([phone]),    [facility] ([phone] -or- [phone]) [phone]   [facility] ([phone]---[phone]---[phone]),   [facility] ([phone], [phone], [phone] or [phone]),    [facility] ([phone]),    [facility] ([phone]),    [facility] ([phone])   [facility] ([phone]),    [facility] ([phone]),    [facility] ([phone] or [phone])   [facility] ([phone] or [phone]),    [facility] ([phone]) 

## 2018-11-17 NOTE — ED Notes (Signed)
Pt given lunch tray.

## 2018-11-17 NOTE — ED Provider Notes (Signed)
-----------------------------------------   7:16 AM on 11/17/2018 -----------------------------------------   Blood pressure 134/89, pulse 74, temperature 98.8 F (37.1 C), temperature source Oral, resp. rate 17, height 5\' 9"  (1.753 m), weight 77.1 kg, SpO2 99 %.  The patient is calm and cooperative at this time.  There have been no acute events since the last update.  Awaiting disposition plan from Behavioral Medicine team.   Arta Silence, MD 11/17/18 [phone]

## 2018-11-17 NOTE — ED Notes (Signed)
Pt given cup of sprite.  

## 2018-11-17 NOTE — ED Notes (Signed)
IVC, PENDING PLACEMENT 

## 2018-11-18 DIAGNOSIS — F201 Disorganized schizophrenia: Secondary | ICD-10-CM | POA: Diagnosis not present

## 2018-11-18 DIAGNOSIS — F209 Schizophrenia, unspecified: Secondary | ICD-10-CM | POA: Diagnosis not present

## 2018-11-18 DIAGNOSIS — G2401 Drug induced subacute dyskinesia: Secondary | ICD-10-CM | POA: Diagnosis not present

## 2018-11-18 LAB — URINE CULTURE

## 2018-11-18 MED ORDER — QUETIAPINE FUMARATE 25 MG PO TABS: 25.0000 mg | ORAL_TABLET | Freq: Once | ORAL | Status: DC

## 2018-11-18 NOTE — ED Notes (Signed)

## 2018-11-18 NOTE — ED Notes (Signed)
He has returned to stand by the door  - he verbalizes no needs or concerns

## 2018-11-18 NOTE — ED Notes (Signed)
BEHAVIORAL HEALTH ROUNDING Patient sleeping: No. Patient alert and oriented: yes Behavior appropriate: Yes.  ; If no, describe:  Nutrition and fluids offered: yes Toileting and hygiene offered: Yes  Sitter present: q15 minute observations and security camera monitoring Law enforcement present: Yes  ODS  

## 2018-11-18 NOTE — ED Notes (Signed)
He has completed his shower this am
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
## 2018-11-18 NOTE — BH Assessment (Signed)
Patient has been accepted to [facility].  Accepting physician is Dr. Alonna Minium.  Call report to [phone].  Representative was Judson Roch (TTS).   ER Staff is aware of it:  Lattie Haw, ER Secretary  Dr. Cinda Quest, ER MD  Amy T., Patient's Nurse
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
## 2018-11-18 NOTE — ED Notes (Signed)
BEHAVIORAL HEALTH ROUNDING Patient sleeping: Yes.   Patient alert and oriented: eyes closed  Appears asleep Behavior appropriate: Yes.  ; If no, describe:  Nutrition and fluids offered: Yes  Toileting and hygiene offered: sleeping Sitter present: q 15 minute observations and security camera monitoring Law enforcement present: yes  ODS 

## 2018-11-18 NOTE — ED Notes (Signed)
[facility]  DEPT  CALLED  FOR TRANSPORT 

## 2018-11-18 NOTE — ED Notes (Signed)
He is standing in the doorway at the nurse's station  - requesting to "talk"  Pt requesting  "I wish you would take me for a ride today and get me out for a little while"  Pt told we have to stay here today cause I have to work   He returned to his room

## 2018-11-18 NOTE — Progress Notes (Signed)
This Probation officer faxed patient Face Sheet and updated vitals per [facility] request.  Areatha Keas. Judi Cong, MSW, [place] Disposition Clinical Social Work [phone] (cell) [phone] (office)

## 2018-11-18 NOTE — Progress Notes (Signed)
[place] to review per Caryl Pina, RN, [facility] Intake and Assessment.  Areatha Keas. Judi Cong, MSW, [place] Disposition Clinical Social Work [phone] (cell) [phone] (office)

## 2018-11-18 NOTE — ED Provider Notes (Signed)
-----------------------------------------   5:11 AM on 11/18/2018 -----------------------------------------   Blood pressure 136/71, pulse 86, temperature 98.1 F (36.7 C), temperature source Oral, resp. rate 17, height 1.753 m (5\' 9" ), weight 77.1 kg, SpO2 100 %.  The patient is calm and cooperative at this time.  There have been no acute events since the last update.  Awaiting disposition plan from Behavioral Medicine team.   Hinda Kehr, MD 11/18/18 [phone]

## 2018-11-18 NOTE — ED Notes (Signed)
ED BHU PLACEMENT JUSTIFICATION Is the patient under IVC or is there intent for IVC:  Is the patient medically cleared: Yes.   Is there vacancy in the ED BHU: Yes.   Is the population mix appropriate for patient: Yes.   Is the patient awaiting placement in inpatient or outpatient setting: Yes.   Has the patient had a psychiatric consult: Yes.   Survey of unit performed for contraband, proper placement and condition of furniture, tampering with fixtures in bathroom, shower, and each patient room: Yes.  ; Findings:  APPEARANCE/BEHAVIOR Calm and cooperative NEURO ASSESSMENT Orientation: oriented x3  Denies pain Hallucinations: No.None noted (Hallucinations)  Denies at this time Speech: Normal Gait: normal RESPIRATORY ASSESSMENT Even  Unlabored respirations  CARDIOVASCULAR ASSESSMENT Pulses equal   regular rate  Skin warm and dry   GASTROINTESTINAL ASSESSMENT no GI complaint EXTREMITIES Full ROM  PLAN OF CARE Provide calm/safe environment. Vital signs assessed twice daily. ED BHU Assessment once each 12-hour shift. Collaborate with TTS when available or as condition indicates. Assure the ED provider has rounded once each shift. Provide and encourage hygiene. Provide redirection as needed. Assess for escalating behavior; address immediately and inform ED provider.  Assess family dynamic and appropriateness for visitation as needed: Yes.  ; If necessary, describe findings:  Educate the patient/family about BHU procedures/visitation: Yes.  ; If necessary, describe findings:

## 2019-01-26 ENCOUNTER — Other Ambulatory Visit: Payer: Self-pay

## 2019-01-26 ENCOUNTER — Encounter: Payer: Self-pay | Admitting: *Deleted

## 2019-01-26 ENCOUNTER — Emergency Department: Payer: Medicare Other

## 2019-01-26 ENCOUNTER — Emergency Department
Admission: EM | Admit: 2019-01-26 | Discharge: 2019-01-27 | Disposition: A | Discharge status: Home / Self Care | Payer: Medicare Other | Attending: Emergency Medicine | Admitting: Emergency Medicine

## 2019-01-26 DIAGNOSIS — Z20828 Contact with and (suspected) exposure to other viral communicable diseases: Secondary | ICD-10-CM | POA: Insufficient documentation

## 2019-01-26 DIAGNOSIS — F209 Schizophrenia, unspecified: Secondary | ICD-10-CM | POA: Diagnosis not present

## 2019-01-26 DIAGNOSIS — F17210 Nicotine dependence, cigarettes, uncomplicated: Secondary | ICD-10-CM | POA: Diagnosis not present

## 2019-01-26 DIAGNOSIS — F201 Disorganized schizophrenia: Secondary | ICD-10-CM | POA: Diagnosis not present

## 2019-01-26 DIAGNOSIS — I10 Essential (primary) hypertension: Secondary | ICD-10-CM | POA: Insufficient documentation

## 2019-01-26 DIAGNOSIS — E039 Hypothyroidism, unspecified: Secondary | ICD-10-CM | POA: Diagnosis present

## 2019-01-26 DIAGNOSIS — G2401 Drug induced subacute dyskinesia: Secondary | ICD-10-CM | POA: Diagnosis present

## 2019-01-26 DIAGNOSIS — R4689 Other symptoms and signs involving appearance and behavior: Secondary | ICD-10-CM | POA: Diagnosis present

## 2019-01-26 DIAGNOSIS — F29 Unspecified psychosis not due to a substance or known physiological condition: Secondary | ICD-10-CM

## 2019-01-26 DIAGNOSIS — R509 Fever, unspecified: Secondary | ICD-10-CM

## 2019-01-26 DIAGNOSIS — Z79899 Other long term (current) drug therapy: Secondary | ICD-10-CM | POA: Diagnosis not present

## 2019-01-26 DIAGNOSIS — N400 Enlarged prostate without lower urinary tract symptoms: Secondary | ICD-10-CM | POA: Diagnosis present

## 2019-01-26 LAB — COMPREHENSIVE METABOLIC PANEL
ALT: 33 U/L (ref 0–44)
AST: 33 U/L (ref 15–41)
Albumin: 3.7 g/dL (ref 3.5–5.0)
Alkaline Phosphatase: 83 U/L (ref 38–126)
Anion gap: 5 (ref 5–15)
BUN: 20 mg/dL (ref 8–23)
CO2: 23 mmol/L (ref 22–32)
Calcium: 9.4 mg/dL (ref 8.9–10.3)
Chloride: 112 mmol/L — ABNORMAL HIGH (ref 98–111)
Creatinine, Ser: 1.18 mg/dL (ref 0.61–1.24)
GFR calc Af Amer: >60 mL/min (ref >60)
GFR calc non Af Amer: >60 mL/min (ref >60)
Glucose, Bld: 110 mg/dL — ABNORMAL HIGH (ref 70–99)
Potassium: 4.2 mmol/L (ref 3.5–5.1)
Sodium: 140 mmol/L (ref 135–145)
Total Bilirubin: 0.6 mg/dL (ref 0.3–1.2)
Total Protein: 7.9 g/dL (ref 6.5–8.1)

## 2019-01-26 LAB — URINE DRUG SCREEN, QUALITATIVE (ARMC ONLY)
Amphetamines, Ur Screen: NOT DETECTED
Barbiturates, Ur Screen: NOT DETECTED
Benzodiazepine, Ur Scrn: NOT DETECTED
Cannabinoid 50 Ng, Ur ~~LOC~~: NOT DETECTED
Cocaine Metabolite,Ur ~~LOC~~: NOT DETECTED
MDMA (Ecstasy)Ur Screen: NOT DETECTED
Methadone Scn, Ur: NOT DETECTED
Opiate, Ur Screen: NOT DETECTED
Phencyclidine (PCP) Ur S: NOT DETECTED
Tricyclic, Ur Screen: NOT DETECTED

## 2019-01-26 LAB — URINALYSIS, COMPLETE (UACMP) WITH MICROSCOPIC
Bacteria, UA: NONE SEEN
Bilirubin Urine: NEGATIVE
Glucose, UA: NEGATIVE mg/dL
Hgb urine dipstick: NEGATIVE
Ketones, ur: NEGATIVE mg/dL
Leukocytes,Ua: NEGATIVE
Nitrite: NEGATIVE
Protein, ur: NEGATIVE mg/dL
Specific Gravity, Urine: 1.024 (ref 1.005–1.030)
pH: 5 (ref 5.0–8.0)

## 2019-01-26 LAB — SARS CORONAVIRUS 2 BY RT PCR (HOSPITAL ORDER, PERFORMED IN ~~LOC~~ HOSPITAL LAB): SARS Coronavirus 2: NEGATIVE

## 2019-01-26 LAB — SALICYLATE LEVEL: Salicylate Lvl: <7 mg/dL (ref 2.8–30.0)

## 2019-01-26 LAB — CBC
HCT: 41.4 % (ref 39.0–52.0)
Hemoglobin: 13.2 g/dL (ref 13.0–17.0)
MCH: 30.9 pg (ref 26.0–34.0)
MCHC: 31.9 g/dL (ref 30.0–36.0)
MCV: 97 fL (ref 80.0–100.0)
Platelets: 131 10*3/uL — ABNORMAL LOW (ref 150–400)
RBC: 4.27 MIL/uL (ref 4.22–5.81)
RDW: 14.8 % (ref 11.5–15.5)
WBC: 5.5 10*3/uL (ref 4.0–10.5)
nRBC: 0 % (ref 0.0–0.2)

## 2019-01-26 LAB — ACETAMINOPHEN LEVEL: Acetaminophen (Tylenol), Serum: <10 ug/mL — ABNORMAL LOW (ref 10–30)

## 2019-01-26 LAB — ETHANOL: Alcohol, Ethyl (B): <10 mg/dL (ref <10)

## 2019-01-26 LAB — LACTIC ACID, PLASMA: Lactic Acid, Venous: 0.7 mmol/L (ref 0.5–1.9)

## 2019-01-26 MED ORDER — ACETAMINOPHEN 325 MG PO TABS
650.0000 mg | ORAL_TABLET | Freq: Once | ORAL | Status: AC
  Administered 2019-01-26: 20:00:00 650 mg via ORAL
  Filled 2019-01-26: qty 2

## 2019-01-26 MED ORDER — TRAZODONE HCL 50 MG PO TABS
150.0000 mg | ORAL_TABLET | Freq: Every day | ORAL | Status: DC
  Administered 2019-01-26: 23:00:00 150 mg via ORAL
  Filled 2019-01-26: qty 1

## 2019-01-26 MED ORDER — QUETIAPINE FUMARATE 25 MG PO TABS
100.0000 mg | ORAL_TABLET | Freq: Every day | ORAL | Status: DC
  Administered 2019-01-26: 100 mg via ORAL
  Filled 2019-01-26: qty 4
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
## 2019-01-26 NOTE — ED Notes (Signed)
Pt belongings: 1 blue/gray fleece blanket, 1 pr black shoes, 1 pr white socks, 1 pr purple socks,  1 pr jeans, 1 red t-shirt, and 1 black baseball hat. Pt's belongings bagged and labeled per policy.

## 2019-01-26 NOTE — ED Notes (Signed)
Pt. Transferred from room 26 to room 23 after dressing out and screening for contraband. Report to include Situation, Background, Assessment and Recommendations from [facility]. Pt. Oriented to Quad including Q15 minute rounds as well as Engineer, drilling for their protection. Patient is alert and oriented, warm and dry in no acute distress. Patient denies SI, HI, and AVH. Pt. Encouraged to let me know if needs arise.
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
## 2019-01-26 NOTE — ED Triage Notes (Signed)
Pt to ED from group home after pt ran away multiple times today. Pt was just discharged from [facility] yesterday. Pt reporting he did not like it there. Group home leader is getting IVC paperwork at this time because pt continues to walk out onto the road in front of cars.   Pt has not medical complaints other than "feelign weak" but pt has a temp of 100.4 and a HR of 122. Oxygen saturation is 94% no cough or congestion.
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
## 2019-01-26 NOTE — BH Assessment (Signed)
Made phone call to Royston Cowper, [phone] to obtain collateral information.  There was not an answer and a HIPPA compliant message was left on the voicemail.

## 2019-01-27 DIAGNOSIS — F209 Schizophrenia, unspecified: Secondary | ICD-10-CM | POA: Diagnosis not present

## 2019-01-27 DIAGNOSIS — F201 Disorganized schizophrenia: Secondary | ICD-10-CM

## 2019-01-27 LAB — CBC WITH DIFFERENTIAL/PLATELET
Abs Immature Granulocytes: 0.03 10*3/uL (ref 0.00–0.07)
Basophils Absolute: 0 10*3/uL (ref 0.0–0.1)
Basophils Relative: 0 %
Eosinophils Absolute: 0 10*3/uL (ref 0.0–0.5)
Eosinophils Relative: 0 %
HCT: 39.6 % (ref 39.0–52.0)
Hemoglobin: 12.5 g/dL — ABNORMAL LOW (ref 13.0–17.0)
Immature Granulocytes: 1 %
Lymphocytes Relative: 32 %
Lymphs Abs: 1.6 10*3/uL (ref 0.7–4.0)
MCH: 31.3 pg (ref 26.0–34.0)
MCHC: 31.6 g/dL (ref 30.0–36.0)
MCV: 99.2 fL (ref 80.0–100.0)
Monocytes Absolute: 0.6 10*3/uL (ref 0.1–1.0)
Monocytes Relative: 13 %
Neutro Abs: 2.8 10*3/uL (ref 1.7–7.7)
Neutrophils Relative %: 54 %
Platelets: 119 10*3/uL — ABNORMAL LOW (ref 150–400)
RBC: 3.99 MIL/uL — ABNORMAL LOW (ref 4.22–5.81)
RDW: 14.9 % (ref 11.5–15.5)
WBC: 5 10*3/uL (ref 4.0–10.5)
nRBC: 0 % (ref 0.0–0.2)

## 2019-01-27 MED ORDER — GENERIC EXTERNAL MEDICATION
Status: DC
Start: ? — End: 2019-01-27

## 2019-01-27 MED ORDER — POLYETHYLENE GLYCOL 3350 17 G PO PACK
17.00 | PACK | ORAL | Status: DC
Start: 2019-01-25 — End: 2019-01-27

## 2019-01-27 MED ORDER — PANTOPRAZOLE SODIUM 40 MG PO TBEC
40.00 | DELAYED_RELEASE_TABLET | ORAL | Status: DC
Start: 2019-01-26 — End: 2019-01-27

## 2019-01-27 MED ORDER — DEUTETRABENAZINE 6 MG PO TABS
12.00 | ORAL_TABLET | ORAL | Status: DC
Start: 2019-01-25 — End: 2019-01-27

## 2019-01-27 MED ORDER — ALUM & MAG HYDROXIDE-SIMETH 200-200-20 MG/5ML PO SUSP
30.00 | ORAL | Status: DC
Start: ? — End: 2019-01-27

## 2019-01-27 MED ORDER — BUSPIRONE HCL 5 MG PO TABS
5.00 | ORAL_TABLET | ORAL | Status: DC
Start: 2019-01-25 — End: 2019-01-27

## 2019-01-27 MED ORDER — PANTOPRAZOLE SODIUM 40 MG PO TBEC
40.0000 mg | DELAYED_RELEASE_TABLET | Freq: Every day | ORAL | Status: DC
  Administered 2019-01-27: 09:00:00 40 mg via ORAL
  Filled 2019-01-27: qty 1

## 2019-01-27 MED ORDER — QUETIAPINE FUMARATE 25 MG PO TABS: 100.0000 mg | ORAL_TABLET | Freq: Every day | ORAL | Status: DC

## 2019-01-27 MED ORDER — LEVOTHYROXINE SODIUM 50 MCG PO TABS
200.0000 ug | ORAL_TABLET | Freq: Every day | ORAL | Status: DC
  Administered 2019-01-27: 200 ug via ORAL
  Filled 2019-01-27: qty 4

## 2019-01-27 MED ORDER — HALOPERIDOL LACTATE 2 MG/ML PO CONC
10.00 | ORAL | Status: DC
Start: 2019-01-25 — End: 2019-01-27

## 2019-01-27 MED ORDER — BENZOCAINE-MENTHOL 15-3.6 MG MT LOZG
1.00 | LOZENGE | OROMUCOSAL | Status: DC
Start: ? — End: 2019-01-27

## 2019-01-27 MED ORDER — OXYBUTYNIN CHLORIDE 5 MG PO TABS
5.0000 mg | ORAL_TABLET | Freq: Two times a day (BID) | ORAL | Status: DC
  Administered 2019-01-27: 5 mg via ORAL
  Filled 2019-01-27 (×2): qty 1

## 2019-01-27 MED ORDER — BISACODYL 5 MG PO TBEC
5.00 | DELAYED_RELEASE_TABLET | ORAL | Status: DC
Start: ? — End: 2019-01-27

## 2019-01-27 MED ORDER — NICOTINE POLACRILEX 2 MG MT GUM
2.00 | CHEWING_GUM | OROMUCOSAL | Status: DC
Start: ? — End: 2019-01-27

## 2019-01-27 MED ORDER — TRAZODONE HCL 100 MG PO TABS
300.00 | ORAL_TABLET | ORAL | Status: DC
Start: 2019-01-25 — End: 2019-01-27

## 2019-01-27 MED ORDER — TAMSULOSIN HCL 0.4 MG PO CAPS
.40 | ORAL_CAPSULE | ORAL | Status: DC
Start: 2019-01-26 — End: 2019-01-27

## 2019-01-27 MED ORDER — GENERIC EXTERNAL MEDICATION
1.50 | Status: DC
Start: 2019-01-25 — End: 2019-01-27

## 2019-01-27 MED ORDER — MELATONIN 5 MG PO TABS
1.0000 | ORAL_TABLET | Freq: Every day | ORAL | Status: DC
  Filled 2019-01-27: qty 1

## 2019-01-27 MED ORDER — FINASTERIDE 5 MG PO TABS
5.00 | ORAL_TABLET | ORAL | Status: DC
Start: 2019-01-26 — End: 2019-01-27

## 2019-01-27 MED ORDER — POLYETHYLENE GLYCOL 3350 17 G PO PACK
17.00 | PACK | ORAL | Status: DC
Start: ? — End: 2019-01-27

## 2019-01-27 MED ORDER — LEVOTHYROXINE SODIUM 100 MCG PO TABS
200.00 | ORAL_TABLET | ORAL | Status: DC
Start: 2019-01-26 — End: 2019-01-27

## 2019-01-27 MED ORDER — TRAZODONE HCL 50 MG PO TABS: 150.0000 mg | ORAL_TABLET | Freq: Every day | ORAL | Status: DC

## 2019-01-27 MED ORDER — FINASTERIDE 5 MG PO TABS
5.0000 mg | ORAL_TABLET | Freq: Every day | ORAL | Status: DC
  Administered 2019-01-27: 5 mg via ORAL
  Filled 2019-01-27: qty 1

## 2019-01-27 MED ORDER — DEUTETRABENAZINE 6 MG PO TABS: 12.0000 mg | ORAL_TABLET | Freq: Two times a day (BID) | ORAL | Status: DC

## 2019-01-27 MED ORDER — LITHIUM CARBONATE ER 450 MG PO TBCR
450.0000 mg | EXTENDED_RELEASE_TABLET | Freq: Two times a day (BID) | ORAL | Status: DC
  Administered 2019-01-27: 450 mg via ORAL
  Filled 2019-01-27 (×2): qty 1

## 2019-01-27 MED ORDER — DIVALPROEX SODIUM 125 MG PO CSDR
1000.0000 mg | DELAYED_RELEASE_CAPSULE | Freq: Two times a day (BID) | ORAL | Status: DC
  Administered 2019-01-27: 1000 mg via ORAL
  Filled 2019-01-27 (×2): qty 8

## 2019-01-27 MED ORDER — DOCUSATE SODIUM 100 MG PO CAPS
100.0000 mg | ORAL_CAPSULE | Freq: Two times a day (BID) | ORAL | Status: DC
  Administered 2019-01-27: 100 mg via ORAL
  Filled 2019-01-27: qty 1

## 2019-01-27 MED ORDER — CLOZAPINE 25 MG PO TABS
200.0000 mg | ORAL_TABLET | Freq: Two times a day (BID) | ORAL | Status: DC
  Administered 2019-01-27: 200 mg via ORAL
  Filled 2019-01-27: qty 8

## 2019-01-27 MED ORDER — DOCUSATE SODIUM 100 MG PO CAPS
200.00 | ORAL_CAPSULE | ORAL | Status: DC
Start: 2019-01-26 — End: 2019-01-27

## 2019-01-27 MED ORDER — LORAZEPAM 1 MG PO TABS
1.00 | ORAL_TABLET | ORAL | Status: DC
Start: ? — End: 2019-01-27

## 2019-01-27 MED ORDER — OXYBUTYNIN CHLORIDE 5 MG PO TABS
5.00 | ORAL_TABLET | ORAL | Status: DC
Start: 2019-01-25 — End: 2019-01-27

## 2019-01-27 MED ORDER — BISACODYL 10 MG RE SUPP
10.00 | RECTAL | Status: DC
Start: ? — End: 2019-01-27

## 2019-01-27 MED ORDER — BENZTROPINE MESYLATE 1 MG PO TABS
0.5000 mg | ORAL_TABLET | Freq: Two times a day (BID) | ORAL | Status: DC
  Administered 2019-01-27: 09:00:00 0.5 mg via ORAL
  Filled 2019-01-27: qty 1

## 2019-01-27 MED ORDER — BUSPIRONE HCL 5 MG PO TABS
5.0000 mg | ORAL_TABLET | Freq: Three times a day (TID) | ORAL | Status: DC
  Administered 2019-01-27 (×2): 5 mg via ORAL
  Filled 2019-01-27 (×2): qty 1

## 2019-01-27 MED ORDER — ACETAMINOPHEN 325 MG PO TABS
650.00 | ORAL_TABLET | ORAL | Status: DC
Start: ? — End: 2019-01-27

## 2019-01-27 MED ORDER — TAMSULOSIN HCL 0.4 MG PO CAPS
0.4000 mg | ORAL_CAPSULE | Freq: Every day | ORAL | Status: DC
  Administered 2019-01-27: 09:00:00 0.4 mg via ORAL
  Filled 2019-01-27: qty 1

## 2019-01-27 MED ORDER — SCHOLLS DONUT HEEL CUSHION PADS
450.00 | MEDICATED_PAD | Status: DC
Start: 2019-01-25 — End: 2019-01-27

## 2019-01-27 MED ORDER — CLOZAPINE 100 MG PO TABS
200.00 | ORAL_TABLET | ORAL | Status: DC
Start: 2019-01-25 — End: 2019-01-27

## 2019-01-27 MED ORDER — DICLOFENAC SODIUM 1 % TD GEL
4.00 | TRANSDERMAL | Status: DC
Start: ? — End: 2019-01-27

## 2019-01-27 MED ORDER — FOLIC ACID 1 MG PO TABS
1.0000 mg | ORAL_TABLET | Freq: Every day | ORAL | Status: DC
  Administered 2019-01-27: 1 mg via ORAL
  Filled 2019-01-27: qty 1

## 2019-01-27 MED ORDER — DIVALPROEX SODIUM 125 MG PO CSDR
1000.00 | DELAYED_RELEASE_CAPSULE | ORAL | Status: DC
Start: 2019-01-25 — End: 2019-01-27

## 2019-01-27 NOTE — ED Notes (Signed)
Pt has been asking staff if they will take him home. He remains cooperative but restless.

## 2019-01-27 NOTE — ED Notes (Signed)
Delton See, pt's guardian, returned call. He said that [facility] "requested" pt return there if pt had any issues upon discharge. He requested that I speak with Bridget Hartshorn., group owner whom this writer had called previously, regarding medication.

## 2019-01-27 NOTE — ED Notes (Signed)
Patient discharged to group home with Randy Ferguson , patient and [name]  received discharge papers. Patient received belongings and verbalized he has received all of his belongings. Patient appropriate and cooperative, Denies SI/HI AVH. Vital signs taken. NAD noted.

## 2019-01-27 NOTE — ED Provider Notes (Addendum)
-----------------------------------------   6:56 AM on 01/27/2019 -----------------------------------------  Blood pressure (!) 149/94, pulse (!) 101, temperature 98.2 F (36.8 C), temperature source Oral, resp. rate 20, height 5\' 9"  (1.753 m), weight 77.1 kg, SpO2 95 %.  The patient is calm and cooperative at this time.  There have been no acute events since the last update. Repeat temperature was normal without fever.  Awaiting disposition plan from Behavioral Medicine team.   Duffy Bruce, MD 01/27/19 [id number]    Duffy Bruce, MD 01/27/19 [phone]

## 2019-01-27 NOTE — ED Notes (Signed)
Pt said he could not take meds with water only, so they were given with applesauce.

## 2019-01-27 NOTE — ED Notes (Signed)
Hourly rounding reveals patient sleeping in room. No complaints, stable, in no acute distress. Q15 minute rounds and monitoring via Rover and Officer to continue.  

## 2019-01-27 NOTE — ED Notes (Signed)
Group home owner told Randy Ferguson that they will be here at 1600 to pick up pt.

## 2019-01-27 NOTE — ED Notes (Signed)
Spoke with pharmacy regarding pt's daily deutetrabenazine for TDS. Neither the med nor a formulary alternative is available. Left HIPAA-compliant voicemail for Delton See [phone], group home owner, to see if group home can provide medication.

## 2019-01-27 NOTE — ED Notes (Signed)
Hourly rounding reveals patient in room. No complaints, stable, in no acute distress. Q15 minute rounds and monitoring via Rover and Officer to continue.   

## 2019-01-27 NOTE — ED Notes (Signed)
Patient sitting in chair in his doorway talking to staff, he has to constantly redirected to stay in his room he likes to come out of room and talk with staff and stand in hallway

## 2019-01-27 NOTE — BH Assessment (Addendum)
Assessment Note  Randy Ferguson is an 63 y.o. male.  The pt came in after leaving the group home and standing in the street with oncoming traffic.  The pt stated he isn't going to back to the group home.  When asked where he lives, the pt stated he lived in "the streets".  He then reported he wants to go to [place].  The pt was tangential during the assessment and talked about random topics that were not related to the questions he was being asked.  He lives at [facility] group home.    Pt is dressed in scrubs. He is alert and  Not oriented. Pt speaks in a slurred tone, at moderate volume and normal pace. Eye contact is good. Pt's mood is irritated. Thought process is tangential. There is indication Pt is currently responding to internal stimuli or experiencing delusional thought content.?Pt was not cooperative throughout assessment.    Diagnosis: F20.9 Schizophrenia  Past Medical History:  Past Medical History:  Diagnosis Date  . GERD (gastroesophageal reflux disease)   . Hypertension   . Schizophrenia ([place])   . Thrombocytopenia ([place])   . Thyroid disease     History reviewed. No pertinent surgical history.  Family History: History reviewed. No pertinent family history.  Social History:  reports that he has been smoking cigarettes. He has been smoking about 0.50 packs per day. He has never used smokeless tobacco. He reports previous alcohol use. He reports previous drug use.  Additional Social History:  Alcohol / Drug Use Pain Medications: See MAR Prescriptions: See MAR Over the Counter: See MAR History of alcohol / drug use?: No history of alcohol / drug abuse Longest period of sobriety (when/how long): UTA  CIWA: CIWA-Ar BP: (!) 149/94 Pulse Rate: (!) 101 COWS:    Allergies: No Known Allergies  Home Medications: (Not in a hospital admission)   OB/GYN Status:  No LMP for male patient.  General Assessment Data Assessment unable to be completed: Yes Reason for not  completing assessment: pt isn't medically cleared Location of Assessment: [facility] ED TTS Assessment: In system Is this a Tele or Face-to-Face Assessment?: Face-to-Face Is this an Initial Assessment or a Re-assessment for this encounter?: Initial Assessment Patient Accompanied by:: N/A Language Other than English: No Living Arrangements: In Group Home: (Comment: Name of Group Home)([facility] [phone]) What gender do you identify as?: Male Marital status: Single Living Arrangements: Group Home Can pt return to current living arrangement?: Yes Admission Status: Involuntary Petitioner: ED Attending Is patient capable of signing voluntary admission?: No Referral Source: Other(group home) Insurance type: Medicare     Crisis Care Plan Living Arrangements: Group Home Legal Guardian: Other:(Vince McKnight) Name of Psychiatrist: UTA Name of Therapist: UTA  Education Status Is patient currently in school?: No Is the patient employed, unemployed or receiving disability?: Receiving disability income  Risk to self with the past 6 months Suicidal Ideation: No Has patient been a risk to self within the past 6 months prior to admission? : No Suicidal Intent: No Has patient had any suicidal intent within the past 6 months prior to admission? : No Is patient at risk for suicide?: No Suicidal Plan?: No Has patient had any suicidal plan within the past 6 months prior to admission? : No Access to Means: No What has been your use of drugs/alcohol within the last 12 months?: none Previous Attempts/Gestures: No How many times?: (UTA) Other Self Harm Risks: UTA Triggers for Past Attempts: Unknown Intentional Self  Injurious Behavior: None Family Suicide History: Unable to assess Recent stressful life event(s): Other (Comment)(UTA) Persecutory voices/beliefs?: Yes Depression: No Depression Symptoms: Feeling angry/irritable Substance abuse history and/or treatment for  substance abuse?: No Suicide prevention information given to non-admitted patients: Not applicable  Risk to Others within the past 6 months Homicidal Ideation: No Does patient have any lifetime risk of violence toward others beyond the six months prior to admission? : No Thoughts of Harm to Others: No Current Homicidal Intent: No Current Homicidal Plan: No Access to Homicidal Means: No Identified Victim: pt denies History of harm to others?: No Assessment of Violence: None Noted Violent Behavior Description: pt denies Does patient have access to weapons?: No Criminal Charges Pending?: No Does patient have a court date: No Is patient on probation?: No  Psychosis Hallucinations: Auditory, Visual Delusions: None noted  Mental Status Report Appearance/Hygiene: Unremarkable, In scrubs Eye Contact: Good Motor Activity: Freedom of movement, Unremarkable Speech: Tangential Level of Consciousness: Alert Mood: Irritable Affect: Other (Comment)(bizarre) Anxiety Level: None Thought Processes: Tangential Judgement: Impaired Orientation: Not oriented Obsessive Compulsive Thoughts/Behaviors: None  Cognitive Functioning Concentration: Decreased Memory: Unable to Assess Is patient IDD: No Insight: Poor Impulse Control: Poor Appetite: (UTA) Sleep: Unable to Assess Vegetative Symptoms: None  ADLScreening [facility] Assessment Services) Patient's cognitive ability adequate to safely complete daily activities?: Yes Patient able to express need for assistance with ADLs?: Yes Independently performs ADLs?: Yes (appropriate for developmental age)  Prior Inpatient Therapy Prior Inpatient Therapy: Yes Prior Therapy Dates: 01/2019 Prior Therapy Facilty/Provider(s): [facility] Reason for Treatment: scizophrenia  Prior Outpatient Therapy Prior Outpatient Therapy: Yes Prior Therapy Dates: UTA Prior Therapy Facilty/Provider(s): UTA Reason for Treatment: UTA Does patient have an ACCT  team?: No Does patient have Intensive In-House Services?  : No Does patient have [facility] services? : No Does patient have P4CC services?: No  ADL Screening (condition at time of admission) Patient's cognitive ability adequate to safely complete daily activities?: Yes Patient able to express need for assistance with ADLs?: Yes Independently performs ADLs?: Yes (appropriate for developmental age)       Abuse/Neglect Assessment (Assessment to be complete while patient is alone) Abuse/Neglect Assessment Can Be Completed: Unable to assess, patient is non-responsive or altered mental status     Advance Directives (For Healthcare) Does Patient Have a Medical Advance Directive?: No Would patient like information on creating a medical advance directive?: No - Patient declined          Disposition:  Disposition Initial Assessment Completed for this Encounter: Yes    NP J. Janee Mornhompson recommends inpatient treatment.  On Site Evaluation by:   Reviewed with Physician:    Randy Ferguson, Randy Ferguson 01/27/2019 2:15 AM

## 2019-01-27 NOTE — ED Notes (Signed)
Per TTS, pt's group home is declining to take pt back. TTS will update RN when possible.

## 2019-01-27 NOTE — ED Notes (Signed)
Patient began walking to fast and fell against the wall, NAD noted patient was assisted back to his bed by writer and he stated he felt dizzy all of a sudden, he is a fall risk and being monitored by staff. Vitals BP 151/93 R 18 P 80 O2 99. Patient sat back on bed and asked for some extra food he is currently eating an extra lunch tray
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
## 2019-01-27 NOTE — ED Notes (Signed)
This Probation officer, Donneta Romberg RN, and [place] TTS have attempted to reach group home without success. Will re-attempt.

## 2019-01-27 NOTE — Progress Notes (Signed)
Pharmacy consult for Clozapine Monitoring  63 yo M on Clozapine 200mg  bid  8/18  ANC 2.8  Labs submitted to clozapine [facility] program Check ANC weekly while in patient Next labs on 02/03/2019.  Chinita Greenland PharmD Clinical Pharmacist 01/27/2019

## 2019-01-27 NOTE — BH Assessment (Signed)
This Probation officer spoke to pt's legal guardian Delton See: [phone]) who reports he spoke with the group home and they plan to pick-up pt. He was unable to provide this Probation officer with a specific ETA.

## 2019-01-27 NOTE — ED Notes (Signed)
Writer attempted to call patients group home owner Sharlot Gowda, no answer and unable to leave a voicemail due to the voicemail being full.   Writer called [employer] to do a Firefighter

## 2019-01-27 NOTE — ED Notes (Signed)
As patient is getting dressed he yelled out to staff I have a extra pair of clothes just in case I have to come back tonight, Probation officer asked him why would he be coming back patient shrugged his shoulders as in (I dont know)

## 2019-01-27 NOTE — ED Notes (Signed)
Writer received phone call from [employer] patients group home owner and he stated staff will be here at 4p today to pick patient up

## 2019-01-27 NOTE — BH Assessment (Signed)
Referral information for Psychiatric Hospitalization faxed to;   Marland Kitchen Cristal [facility] [phone]),    . [facility] [phone]),   . Mikel Cella [phone], [phone], [phone] or [phone]),    . [facility] [phone] or [phone])  . [place] [phone] or [phone]),

## 2019-01-27 NOTE — ED Provider Notes (Signed)
Patient has been medically cleared for discharge    Earleen Newport, MD 01/27/19 1221

## 2019-01-27 NOTE — ED Notes (Signed)
Pt was up to bathroom. He is now resting in bed after this writer straightened out blankets and encouraged rest. Lights turned down.

## 2019-01-27 NOTE — ED Notes (Signed)
After psych/TTS discussed returning to the group home with pt., he laid down in the floor with a blanket over him. He did not get up when this writer asked, but he remains safe. Will continue to monitor for needs/safety.

## 2019-01-29 ENCOUNTER — Other Ambulatory Visit: Payer: Self-pay

## 2019-01-29 ENCOUNTER — Emergency Department
Admission: EM | Admit: 2019-01-29 | Discharge: 2019-01-30 | Disposition: A | Discharge status: Home / Self Care | Payer: Medicare Other | Attending: Emergency Medicine | Admitting: Emergency Medicine

## 2019-01-29 DIAGNOSIS — Z79899 Other long term (current) drug therapy: Secondary | ICD-10-CM | POA: Insufficient documentation

## 2019-01-29 DIAGNOSIS — F17210 Nicotine dependence, cigarettes, uncomplicated: Secondary | ICD-10-CM | POA: Diagnosis not present

## 2019-01-29 DIAGNOSIS — F201 Disorganized schizophrenia: Secondary | ICD-10-CM | POA: Diagnosis not present

## 2019-01-29 DIAGNOSIS — R451 Restlessness and agitation: Secondary | ICD-10-CM | POA: Diagnosis not present

## 2019-01-29 DIAGNOSIS — E039 Hypothyroidism, unspecified: Secondary | ICD-10-CM | POA: Insufficient documentation

## 2019-01-29 DIAGNOSIS — Z046 Encounter for general psychiatric examination, requested by authority: Secondary | ICD-10-CM | POA: Insufficient documentation

## 2019-01-29 DIAGNOSIS — I10 Essential (primary) hypertension: Secondary | ICD-10-CM | POA: Insufficient documentation

## 2019-01-29 DIAGNOSIS — R4689 Other symptoms and signs involving appearance and behavior: Secondary | ICD-10-CM | POA: Diagnosis not present

## 2019-01-29 DIAGNOSIS — F209 Schizophrenia, unspecified: Secondary | ICD-10-CM | POA: Diagnosis present

## 2019-01-29 DIAGNOSIS — R410 Disorientation, unspecified: Secondary | ICD-10-CM | POA: Diagnosis present

## 2019-01-29 LAB — COMPREHENSIVE METABOLIC PANEL
ALT: 30 U/L (ref 0–44)
AST: 28 U/L (ref 15–41)
Albumin: 3.5 g/dL (ref 3.5–5.0)
Alkaline Phosphatase: 72 U/L (ref 38–126)
Anion gap: 10 (ref 5–15)
BUN: 18 mg/dL (ref 8–23)
CO2: 18 mmol/L — ABNORMAL LOW (ref 22–32)
Calcium: 8.8 mg/dL — ABNORMAL LOW (ref 8.9–10.3)
Chloride: 112 mmol/L — ABNORMAL HIGH (ref 98–111)
Creatinine, Ser: 1.13 mg/dL (ref 0.61–1.24)
GFR calc Af Amer: >60 mL/min (ref >60)
GFR calc non Af Amer: >60 mL/min (ref >60)
Glucose, Bld: 94 mg/dL (ref 70–99)
Potassium: 4 mmol/L (ref 3.5–5.1)
Sodium: 140 mmol/L (ref 135–145)
Total Bilirubin: 0.6 mg/dL (ref 0.3–1.2)
Total Protein: 7.1 g/dL (ref 6.5–8.1)

## 2019-01-29 LAB — URINE DRUG SCREEN, QUALITATIVE (ARMC ONLY)
Amphetamines, Ur Screen: POSITIVE — AB
Barbiturates, Ur Screen: NOT DETECTED
Benzodiazepine, Ur Scrn: NOT DETECTED
Cannabinoid 50 Ng, Ur ~~LOC~~: NOT DETECTED
Cocaine Metabolite,Ur ~~LOC~~: NOT DETECTED
MDMA (Ecstasy)Ur Screen: NOT DETECTED
Methadone Scn, Ur: NOT DETECTED
Opiate, Ur Screen: NOT DETECTED
Phencyclidine (PCP) Ur S: NOT DETECTED
Tricyclic, Ur Screen: NOT DETECTED

## 2019-01-29 LAB — CBC
HCT: 44.3 % (ref 39.0–52.0)
Hemoglobin: 13.5 g/dL (ref 13.0–17.0)
MCH: 31.6 pg (ref 26.0–34.0)
MCHC: 30.5 g/dL (ref 30.0–36.0)
MCV: 103.7 fL — ABNORMAL HIGH (ref 80.0–100.0)
Platelets: 109 10*3/uL — ABNORMAL LOW (ref 150–400)
RBC: 4.27 MIL/uL (ref 4.22–5.81)
RDW: 14.9 % (ref 11.5–15.5)
WBC: 5.5 10*3/uL (ref 4.0–10.5)
nRBC: 0 % (ref 0.0–0.2)

## 2019-01-29 LAB — ETHANOL: Alcohol, Ethyl (B): <10 mg/dL (ref <10)

## 2019-01-29 LAB — SALICYLATE LEVEL: Salicylate Lvl: <7 mg/dL (ref 2.8–30.0)

## 2019-01-29 LAB — ACETAMINOPHEN LEVEL: Acetaminophen (Tylenol), Serum: <10 ug/mL — ABNORMAL LOW (ref 10–30)

## 2019-01-29 NOTE — ED Triage Notes (Signed)
Pt here with IVC papers with [employer] for mental health evaluation. Pt is not sure why he is here. Pt with stream of thoughts, it is difficult to obtain information from pt. Pt with history of schizophrenia per ivc papers.

## 2019-01-29 NOTE — ED Notes (Signed)
TTS contacted the legal guardian Randy Ferguson: [phone] -no one answered.  TTS was unable to leave message and instructed to call on-call provider at [phone]. TTS contacted on call provider, Randy Ferguson, at [facility].  She stated that she will provide the information to [facility].  Randy Ferguson provided verbal consent for treatment.

## 2019-01-29 NOTE — ED Notes (Signed)
Gave pt new scrub pants, two blankets, sandwich tray and diet ginger ale.

## 2019-01-29 NOTE — ED Notes (Signed)
TTS computer placed in room for assessment.
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
## 2019-01-29 NOTE — ED Notes (Addendum)
Pt has his own grey adult protective underwear on. dorian edt, walked pt back to treatment room with labeled belonging bag.

## 2019-01-29 NOTE — ED Notes (Addendum)
Pt dressed out by this Rn and dorian, ed tech, the following items placed in one of one labeled bag: yellow non skid socks x4, grey pants, black ball cap, red and white t shirt, black slippers, black belt, sheetz card, three single care pharmacy discount cards,
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
## 2019-01-30 ENCOUNTER — Encounter: Payer: Self-pay | Admitting: Emergency Medicine

## 2019-01-30 ENCOUNTER — Other Ambulatory Visit: Payer: Self-pay

## 2019-01-30 ENCOUNTER — Emergency Department (EMERGENCY_DEPARTMENT_HOSPITAL)
Admission: EM | Admit: 2019-01-30 | Discharge: 2019-02-01 | Disposition: A | Discharge status: Home / Self Care | Payer: Medicare Other | Source: Home / Self Care | Attending: Emergency Medicine | Admitting: Emergency Medicine

## 2019-01-30 DIAGNOSIS — I10 Essential (primary) hypertension: Secondary | ICD-10-CM | POA: Insufficient documentation

## 2019-01-30 DIAGNOSIS — R4689 Other symptoms and signs involving appearance and behavior: Secondary | ICD-10-CM

## 2019-01-30 DIAGNOSIS — F209 Schizophrenia, unspecified: Secondary | ICD-10-CM | POA: Diagnosis not present

## 2019-01-30 DIAGNOSIS — F17210 Nicotine dependence, cigarettes, uncomplicated: Secondary | ICD-10-CM | POA: Insufficient documentation

## 2019-01-30 DIAGNOSIS — Z79899 Other long term (current) drug therapy: Secondary | ICD-10-CM | POA: Insufficient documentation

## 2019-01-30 DIAGNOSIS — F201 Disorganized schizophrenia: Secondary | ICD-10-CM | POA: Insufficient documentation

## 2019-01-30 DIAGNOSIS — E039 Hypothyroidism, unspecified: Secondary | ICD-10-CM | POA: Insufficient documentation

## 2019-01-30 LAB — CBC
HCT: 39.4 % (ref 39.0–52.0)
Hemoglobin: 12.6 g/dL — ABNORMAL LOW (ref 13.0–17.0)
MCH: 31 pg (ref 26.0–34.0)
MCHC: 32 g/dL (ref 30.0–36.0)
MCV: 96.8 fL (ref 80.0–100.0)
Platelets: 106 10*3/uL — ABNORMAL LOW (ref 150–400)
RBC: 4.07 MIL/uL — ABNORMAL LOW (ref 4.22–5.81)
RDW: 14.6 % (ref 11.5–15.5)
WBC: 3.9 10*3/uL — ABNORMAL LOW (ref 4.0–10.5)
nRBC: 0 % (ref 0.0–0.2)

## 2019-01-30 LAB — VALPROIC ACID LEVEL: Valproic Acid Lvl: 28 ug/mL — ABNORMAL LOW (ref 50.0–100.0)

## 2019-01-30 LAB — ETHANOL: Alcohol, Ethyl (B): <10 mg/dL (ref <10)

## 2019-01-30 LAB — T4, FREE: Free T4: 0.69 ng/dL (ref 0.61–1.12)

## 2019-01-30 LAB — COMPREHENSIVE METABOLIC PANEL
ALT: 31 U/L (ref 0–44)
AST: 30 U/L (ref 15–41)
Albumin: 3.4 g/dL — ABNORMAL LOW (ref 3.5–5.0)
Alkaline Phosphatase: 79 U/L (ref 38–126)
Anion gap: 7 (ref 5–15)
BUN: 21 mg/dL (ref 8–23)
CO2: 23 mmol/L (ref 22–32)
Calcium: 9.1 mg/dL (ref 8.9–10.3)
Chloride: 111 mmol/L (ref 98–111)
Creatinine, Ser: 1.08 mg/dL (ref 0.61–1.24)
GFR calc Af Amer: >60 mL/min (ref >60)
GFR calc non Af Amer: >60 mL/min (ref >60)
Glucose, Bld: 147 mg/dL — ABNORMAL HIGH (ref 70–99)
Potassium: 4.1 mmol/L (ref 3.5–5.1)
Sodium: 141 mmol/L (ref 135–145)
Total Bilirubin: 0.5 mg/dL (ref 0.3–1.2)
Total Protein: 7.2 g/dL (ref 6.5–8.1)

## 2019-01-30 LAB — ACETAMINOPHEN LEVEL: Acetaminophen (Tylenol), Serum: <10 ug/mL — ABNORMAL LOW (ref 10–30)

## 2019-01-30 LAB — SALICYLATE LEVEL: Salicylate Lvl: <7 mg/dL (ref 2.8–30.0)

## 2019-01-30 LAB — TSH: TSH: 2.473 u[IU]/mL (ref 0.350–4.500)

## 2019-01-30 LAB — LITHIUM LEVEL: Lithium Lvl: 0.53 mmol/L — ABNORMAL LOW (ref 0.60–1.20)

## 2019-01-30 MED ORDER — LITHIUM CARBONATE ER 450 MG PO TBCR: 450.0000 mg | EXTENDED_RELEASE_TABLET | Freq: Two times a day (BID) | ORAL | Status: DC

## 2019-01-30 MED ORDER — DEUTETRABENAZINE 6 MG PO TABS: 12.0000 mg | ORAL_TABLET | Freq: Two times a day (BID) | ORAL | Status: DC

## 2019-01-30 MED ORDER — PANTOPRAZOLE SODIUM 40 MG PO TBEC
40.0000 mg | DELAYED_RELEASE_TABLET | Freq: Every day | ORAL | Status: DC
  Administered 2019-01-30: 40 mg via ORAL
  Filled 2019-01-30: qty 1

## 2019-01-30 MED ORDER — DOCUSATE SODIUM 100 MG PO CAPS: 100.0000 mg | ORAL_CAPSULE | Freq: Two times a day (BID) | ORAL | Status: DC

## 2019-01-30 MED ORDER — HALOPERIDOL 5 MG PO TABS
10.0000 mg | ORAL_TABLET | Freq: Three times a day (TID) | ORAL | Status: DC
  Administered 2019-01-30: 10 mg via ORAL
  Filled 2019-01-30: qty 2

## 2019-01-30 MED ORDER — BENZTROPINE MESYLATE 1 MG PO TABS: 0.5000 mg | ORAL_TABLET | Freq: Two times a day (BID) | ORAL | Status: DC

## 2019-01-30 MED ORDER — LEVOTHYROXINE SODIUM 50 MCG PO TABS: 200.0000 ug | ORAL_TABLET | Freq: Every day | ORAL | Status: DC

## 2019-01-30 MED ORDER — DIVALPROEX SODIUM 500 MG PO DR TAB: 500.0000 mg | DELAYED_RELEASE_TABLET | Freq: Three times a day (TID) | ORAL | Status: DC

## 2019-01-30 MED ORDER — PANTOPRAZOLE SODIUM 40 MG PO TBEC: 40.0000 mg | DELAYED_RELEASE_TABLET | Freq: Every day | ORAL | Status: DC

## 2019-01-30 MED ORDER — OXYBUTYNIN CHLORIDE 5 MG PO TABS
5.0000 mg | ORAL_TABLET | Freq: Two times a day (BID) | ORAL | Status: DC
  Administered 2019-01-30: 18:00:00 5 mg via ORAL
  Filled 2019-01-30: qty 1

## 2019-01-30 MED ORDER — BENZTROPINE MESYLATE 1 MG PO TABS: 1.5000 mg | ORAL_TABLET | Freq: Two times a day (BID) | ORAL | Status: DC

## 2019-01-30 MED ORDER — DIVALPROEX SODIUM 125 MG PO CSDR
1000.0000 mg | DELAYED_RELEASE_CAPSULE | Freq: Two times a day (BID) | ORAL | Status: DC
  Filled 2019-01-30: qty 8

## 2019-01-30 MED ORDER — CLOZAPINE 25 MG PO TABS
200.0000 mg | ORAL_TABLET | Freq: Two times a day (BID) | ORAL | Status: DC
  Administered 2019-01-30: 200 mg via ORAL
  Filled 2019-01-30: qty 8

## 2019-01-30 MED ORDER — BUSPIRONE HCL 5 MG PO TABS
5.0000 mg | ORAL_TABLET | Freq: Three times a day (TID) | ORAL | Status: DC
  Administered 2019-01-30: 17:00:00 5 mg via ORAL
  Filled 2019-01-30: qty 1

## 2019-01-30 MED ORDER — TRAZODONE HCL 50 MG PO TABS: 150.0000 mg | ORAL_TABLET | Freq: Every day | ORAL | Status: DC

## 2019-01-30 MED ORDER — POLYETHYLENE GLYCOL 3350 17 G PO PACK: 17.0000 g | PACK | Freq: Every day | ORAL | Status: DC

## 2019-01-30 MED ORDER — QUETIAPINE FUMARATE 25 MG PO TABS: 100.0000 mg | ORAL_TABLET | Freq: Every day | ORAL | Status: DC

## 2019-01-30 MED ORDER — FINASTERIDE 5 MG PO TABS
5.0000 mg | ORAL_TABLET | Freq: Every day | ORAL | Status: DC
  Administered 2019-01-30: 17:00:00 5 mg via ORAL
  Filled 2019-01-30: qty 1

## 2019-01-30 MED ORDER — DIVALPROEX SODIUM 125 MG PO DR TAB
125.0000 mg | DELAYED_RELEASE_TABLET | Freq: Two times a day (BID) | ORAL | Status: DC
  Administered 2019-01-30: 17:00:00 125 mg via ORAL
  Filled 2019-01-30: qty 1

## 2019-01-30 MED ORDER — TAMSULOSIN HCL 0.4 MG PO CAPS
0.4000 mg | ORAL_CAPSULE | Freq: Every day | ORAL | Status: DC
  Administered 2019-01-30: 18:00:00 0.4 mg via ORAL
  Filled 2019-01-30: qty 1

## 2019-01-30 NOTE — ED Notes (Signed)
Pt given dinner tray and sprite.  

## 2019-01-30 NOTE — ED Notes (Signed)
Legal Lincoln Brigham at [phone] notified of pt's arrival for treatment
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
## 2019-01-30 NOTE — ED Notes (Signed)
Pt discharged back to group home.  VS stable. Legal guardian made aware of disposition.  All belongings returned to patient.  Pt denies SI/HI. Discharge instructions reviewed with patient. Patient signed for discharge.
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
## 2019-01-30 NOTE — ED Notes (Signed)
Pt given meal tray. Maintained on 15 minute checks.

## 2019-01-30 NOTE — ED Notes (Signed)
Pt is taking a shower.  No behavioral issues at this time. Maintained on 15 minute checks.

## 2019-01-30 NOTE — ED Notes (Signed)
Pt given peanut butter and crackers and a sprite

## 2019-01-30 NOTE — ED Notes (Signed)
TTS spoke to the call on-call provider for [facility] ([place] [facility]) at [phone]. TTS spoke with the on call provider, Tandy Gaw Scales,.  She stated that she will provide the information to [facility].  [place] Scales provided verbal consent for treatment.   Per [employer], the Group home owner Elonda [place]) and primary guardian Delton See) have a meeting to take Mr. Laursen to [facility] on Monday, due to his recent behaviors.

## 2019-01-30 NOTE — ED Notes (Signed)
Pt up using restroom at this time with no assistance from staff

## 2019-01-30 NOTE — BH Assessment (Signed)
TTS received a return call from [facility] [phone].  Vince shared the following information:   The patient has been eloping.  He runs out in the street, and was out in a diaper.  Lately, he has been erratic, screaming,and running out of the house.  He was recently discharged from [facility], and he has been to the hospital three times in the past week.

## 2019-01-30 NOTE — Discharge Instructions (Signed)
° °  Continue current medications: clozaril, depakote, lithium, haldol, synthroid, cogentin, deutetrabenazine, buspar, trazodone, seroquel.  Follow-up with the primary psychiatrist.  Return to the ER for any new or worsening symptoms or concern for danger to self or others.  If patient continues to run away from the group home, a higher level of care is recommended.

## 2019-01-30 NOTE — ED Notes (Signed)
Pt given lunch tray.

## 2019-01-30 NOTE — ED Notes (Signed)
Pt is wearing an adult pull up; belongings placed in bag-red shirt, black pants, black house slippers and black hat

## 2019-01-31 DIAGNOSIS — R4689 Other symptoms and signs involving appearance and behavior: Secondary | ICD-10-CM | POA: Diagnosis not present

## 2019-01-31 DIAGNOSIS — F201 Disorganized schizophrenia: Secondary | ICD-10-CM | POA: Diagnosis not present

## 2019-01-31 DIAGNOSIS — F209 Schizophrenia, unspecified: Secondary | ICD-10-CM

## 2019-01-31 LAB — CBC WITH DIFFERENTIAL/PLATELET
Abs Immature Granulocytes: 0.01 10*3/uL (ref 0.00–0.07)
Basophils Absolute: 0 10*3/uL (ref 0.0–0.1)
Basophils Relative: 0 %
Eosinophils Absolute: 0 10*3/uL (ref 0.0–0.5)
Eosinophils Relative: 0 %
HCT: 37 % — ABNORMAL LOW (ref 39.0–52.0)
Hemoglobin: 11.8 g/dL — ABNORMAL LOW (ref 13.0–17.0)
Immature Granulocytes: 0 %
Lymphocytes Relative: 46 %
Lymphs Abs: 2.2 10*3/uL (ref 0.7–4.0)
MCH: 31.2 pg (ref 26.0–34.0)
MCHC: 31.9 g/dL (ref 30.0–36.0)
MCV: 97.9 fL (ref 80.0–100.0)
Monocytes Absolute: 0.6 10*3/uL (ref 0.1–1.0)
Monocytes Relative: 13 %
Neutro Abs: 2 10*3/uL (ref 1.7–7.7)
Neutrophils Relative %: 41 %
Platelets: 105 10*3/uL — ABNORMAL LOW (ref 150–400)
RBC: 3.78 MIL/uL — ABNORMAL LOW (ref 4.22–5.81)
RDW: 14.9 % (ref 11.5–15.5)
WBC: 4.7 10*3/uL (ref 4.0–10.5)
nRBC: 0 % (ref 0.0–0.2)

## 2019-01-31 MED ORDER — POLYETHYLENE GLYCOL 3350 17 G PO PACK
17.0000 g | PACK | Freq: Every day | ORAL | Status: DC
  Administered 2019-01-31 – 2019-02-01 (×2): 17 g via ORAL
  Filled 2019-01-31: qty 1

## 2019-01-31 MED ORDER — CLOZAPINE 100 MG PO TABS
200.0000 mg | ORAL_TABLET | Freq: Two times a day (BID) | ORAL | Status: DC
  Administered 2019-01-31 – 2019-02-01 (×3): 200 mg via ORAL
  Filled 2019-01-31: qty 8
  Filled 2019-01-31: qty 2
  Filled 2019-01-31: qty 8

## 2019-01-31 MED ORDER — DOCUSATE SODIUM 100 MG PO CAPS
100.0000 mg | ORAL_CAPSULE | Freq: Two times a day (BID) | ORAL | Status: DC
  Administered 2019-01-31 – 2019-02-01 (×3): 100 mg via ORAL
  Filled 2019-01-31 (×2): qty 1

## 2019-01-31 MED ORDER — BENZTROPINE MESYLATE 1 MG PO TABS: 0.5000 mg | ORAL_TABLET | Freq: Two times a day (BID) | ORAL | Status: DC

## 2019-01-31 MED ORDER — LEVOTHYROXINE SODIUM 50 MCG PO TABS
200.0000 ug | ORAL_TABLET | Freq: Every day | ORAL | Status: DC
  Administered 2019-02-01: 200 ug via ORAL
  Filled 2019-01-31: qty 4

## 2019-01-31 MED ORDER — DEUTETRABENAZINE 6 MG PO TABS: 12.0000 mg | ORAL_TABLET | Freq: Two times a day (BID) | ORAL | Status: DC

## 2019-01-31 MED ORDER — DIVALPROEX SODIUM 125 MG PO DR TAB
125.0000 mg | DELAYED_RELEASE_TABLET | Freq: Three times a day (TID) | ORAL | Status: DC
  Administered 2019-01-31 – 2019-02-01 (×5): 125 mg via ORAL
  Filled 2019-01-31 (×5): qty 1

## 2019-01-31 MED ORDER — LITHIUM CARBONATE ER 450 MG PO TBCR
450.0000 mg | EXTENDED_RELEASE_TABLET | Freq: Two times a day (BID) | ORAL | Status: DC
  Administered 2019-01-31 – 2019-02-01 (×2): 450 mg via ORAL
  Filled 2019-01-31 (×4): qty 1

## 2019-01-31 MED ORDER — PANTOPRAZOLE SODIUM 40 MG PO TBEC
40.0000 mg | DELAYED_RELEASE_TABLET | Freq: Every day | ORAL | Status: DC
  Administered 2019-02-01: 40 mg via ORAL
  Filled 2019-01-31: qty 1

## 2019-01-31 MED ORDER — DIVALPROEX SODIUM 500 MG PO DR TAB: 500.0000 mg | DELAYED_RELEASE_TABLET | Freq: Three times a day (TID) | ORAL | Status: DC

## 2019-01-31 MED ORDER — FINASTERIDE 5 MG PO TABS
5.0000 mg | ORAL_TABLET | Freq: Every day | ORAL | Status: DC
  Administered 2019-02-01: 10:00:00 5 mg via ORAL
  Filled 2019-01-31 (×2): qty 1

## 2019-01-31 MED ORDER — DIVALPROEX SODIUM 125 MG PO CSDR
1000.0000 mg | DELAYED_RELEASE_CAPSULE | Freq: Two times a day (BID) | ORAL | Status: DC
  Administered 2019-01-31 – 2019-02-01 (×2): 1000 mg via ORAL
  Filled 2019-01-31 (×4): qty 8

## 2019-01-31 MED ORDER — TAMSULOSIN HCL 0.4 MG PO CAPS
0.4000 mg | ORAL_CAPSULE | Freq: Every day | ORAL | Status: DC
  Administered 2019-01-31 – 2019-02-01 (×2): 0.4 mg via ORAL
  Filled 2019-01-31: qty 1

## 2019-01-31 MED ORDER — OXYBUTYNIN CHLORIDE 5 MG PO TABS
5.0000 mg | ORAL_TABLET | Freq: Two times a day (BID) | ORAL | Status: DC
  Administered 2019-01-31 – 2019-02-01 (×2): 5 mg via ORAL
  Filled 2019-01-31 (×4): qty 1

## 2019-01-31 MED ORDER — PANTOPRAZOLE SODIUM 40 MG PO TBEC: 40.0000 mg | DELAYED_RELEASE_TABLET | Freq: Every day | ORAL | Status: DC

## 2019-01-31 MED ORDER — HALOPERIDOL 5 MG PO TABS
10.0000 mg | ORAL_TABLET | Freq: Three times a day (TID) | ORAL | Status: DC
  Administered 2019-01-31 – 2019-02-01 (×5): 10 mg via ORAL
  Filled 2019-01-31 (×4): qty 2

## 2019-01-31 MED ORDER — BUSPIRONE HCL 5 MG PO TABS
5.0000 mg | ORAL_TABLET | Freq: Three times a day (TID) | ORAL | Status: DC
  Administered 2019-01-31 – 2019-02-01 (×5): 5 mg via ORAL
  Filled 2019-01-31 (×4): qty 1

## 2019-01-31 MED ORDER — TRAZODONE HCL 50 MG PO TABS
150.0000 mg | ORAL_TABLET | Freq: Every day | ORAL | Status: DC
  Administered 2019-01-31: 21:00:00 150 mg via ORAL
  Filled 2019-01-31: qty 1

## 2019-01-31 MED ORDER — BENZTROPINE MESYLATE 1 MG PO TABS
1.5000 mg | ORAL_TABLET | Freq: Two times a day (BID) | ORAL | Status: DC
  Administered 2019-01-31 – 2019-02-01 (×3): 1.5 mg via ORAL
  Filled 2019-01-31 (×2): qty 2

## 2019-01-31 MED ORDER — QUETIAPINE FUMARATE 25 MG PO TABS
100.0000 mg | ORAL_TABLET | Freq: Every day | ORAL | Status: DC
  Administered 2019-01-31: 100 mg via ORAL
  Filled 2019-01-31: qty 4

## 2019-01-31 NOTE — ED Notes (Signed)
Report received on pt from [place], [place]

## 2019-01-31 NOTE — ED Notes (Signed)
Pt continuously asks for food and drinks and walks back and forth to the bathroom.

## 2019-01-31 NOTE — ED Notes (Signed)
Resumed care from [place], [place]. Pt alert & acting appropriately. NAD noted.

## 2019-01-31 NOTE — ED Notes (Signed)
Pt given snack of graham crackers, peanut butter, chocolate milk
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
## 2019-01-31 NOTE — ED Notes (Signed)
Pt got up and went to the bathroom independently.

## 2019-01-31 NOTE — ED Notes (Signed)
Pt took a shower; given new clothing and toothpaste/toothbrush. Pt resting at this time.
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
## 2019-01-31 NOTE — Consult Note (Signed)
Pharmacy consulted for Clozapine lab monitoring and REMs program reporting.    Assessment: Pt was on Clozaril 200 mg PO twice daily.    Date  [place]  8/23 2000            Goal of Therapy:  CLOZAPINE MONITORING (reflects NEW REMS GUIDELINES EFFECTIVE 03/21/2014):   Check ANC at least weekly while inpatient.   For general population patients, i.e., those without benign ethnic neutropenia (BEN): --If [place] 1000-1499, increase ANC monitoring to 3x/wk --If ANC < 1000, HOLD CLOZAPINE and get psych consult     For patients with baseline labs: --If [place], increase ANC monitoring to 3x/wk --If ANC < 500, HOLD CLOZAPINE and get psych consult   REMS-certified psychiatry provider can continue drug with [place] below cited thresholds if they document medical opinion that the neutropenia is not clozapine-induced (heme consult is recommended) or that risk of interrupting therapy is greater than the risk of developing severe neutropenia.   --SEE PRESCRIBING INFORMATION FOR ADDITIONAL DETAILS     Plan:  Pt was NOT registered with clozaril REMS program today -unable to register the patient online at this time and REMS registry was closed today. Will follow up tomorrow to register patient.   Pt should have [place] monitored weekly.   Next [place] check on 8/30 @ 0800.    Kristeen Miss, PharmD Clinical Pharmacist

## 2019-01-31 NOTE — Social Work (Addendum)
Social work consult received.  Chart reviewed.  SW will consult w/TTS regarding next step.  This patient clearly is in need of a higher level of care. Will wait for psychiatric consult disposition.    Patient's legal guardian is  [place] [employer] [place] [phone].    Berenice Bouton, MSW, LCSW  CSW ED # [phone]

## 2019-01-31 NOTE — ED Notes (Signed)
Sandwich tray and water cup was given.

## 2019-01-31 NOTE — ED Notes (Signed)
Spoke to pharmacy regarding patient scheduled medications. This Probation officer was informed that patient medication not available through pharmacy and must supply for dosing.

## 2019-01-31 NOTE — ED Notes (Signed)
Pt given dinner tray.

## 2019-01-31 NOTE — Progress Notes (Signed)
Report on Situation, Background, Assessment, and Recommendations received from day shift RN. Patient alert and in no visible distress. Pt. Made aware of 1:1 and security cameras for their safety. Patient instructed to come to me with needs or concerns.

## 2019-02-01 DIAGNOSIS — F209 Schizophrenia, unspecified: Secondary | ICD-10-CM | POA: Diagnosis not present

## 2019-02-01 NOTE — BH Assessment (Signed)
Writer called and spoke with patient's guardian (Vince McKnight-[phone]) and he confirmed the Group Home will be picking the patient up. Guardian provided the number for [place] Administrator, Mr. Elonda Husky.   Writer called and left a HIPPA Compliant message with Group Home Administrator (Mr. [phone]), requesting a return phone call.  Writer updated patient's nurse Opal Sidles).

## 2019-02-01 NOTE — ED Notes (Signed)
AAOx3.  Skin warm and dry.  D/C to care of [facility].

## 2019-02-01 NOTE — ED Notes (Signed)
Pt awake , alert , ambulating to and from the bathroom , calm , slow steady gait

## 2019-02-01 NOTE — ED Notes (Signed)
While pt was changing clothes, pt stated "I am not going no where till yall give me my bank card." This tech looked and could not find a bank card. Pt stated "It is a brown bank card." I do not see any documentation of pt having a bank card on him once he arrived. Jane,RN made aware.

## 2019-02-01 NOTE — Discharge Instructions (Signed)
Higher level of care needed vs group home placement

## 2019-02-01 NOTE — Consult Note (Signed)
Pharmacy consulted for Clozapine lab monitoring and REMs program reporting.    Assessment: Pt was on Clozaril 200 mg PO twice daily.    Date  [place]  8/23 2000            Goal of Therapy:  CLOZAPINE MONITORING (reflects NEW REMS GUIDELINES EFFECTIVE 03/21/2014):   Check ANC at least weekly while inpatient.   For general population patients, i.e., those without benign ethnic neutropenia (BEN): --If [place] 1000-1499, increase ANC monitoring to 3x/wk --If ANC < 1000, HOLD CLOZAPINE and get psych consult     For patients with baseline labs: --If [place], increase ANC monitoring to 3x/wk --If ANC < 500, HOLD CLOZAPINE and get psych consult   REMS-certified psychiatry provider can continue drug with [place] below cited thresholds if they document medical opinion that the neutropenia is not clozapine-induced (heme consult is recommended) or that risk of interrupting therapy is greater than the risk of developing severe neutropenia.   --SEE PRESCRIBING INFORMATION FOR ADDITIONAL DETAILS     Plan:  Pt was NOT registered with clozaril REMS program today -unable to register the patient online at this time and REMS registry was closed today. Will follow up tomorrow to register patient.   Pt should have [place] monitored weekly.   Next [place] check on 8/30 @ 0800.    8/24 submitted labs to clozapine REMS  .Chinita Greenland PharmD Clinical Pharmacist 02/01/2019

## 2019-02-01 NOTE — BH Assessment (Signed)
[facility] they will pick the patient up at 3pm. Writer updated patient's nurse Marya Amsler).

## 2019-02-01 NOTE — ED Provider Notes (Addendum)
-----------------------------------------   12:53 PM on 02/01/2019 -----------------------------------------  Blood pressure 132/78, pulse 82, temperature 97.8 F (36.6 C), temperature source Oral, resp. rate 16, height 5\' 9"  (1.753 m), weight 77.1 kg, SpO2 99 %.  Assuming care from Dr. Jacqualine Code.  In short, Randy Ferguson is a 63 y.o. male with a chief complaint of Mental Health Problem .  Refer to the original H&P for additional details.  The current plan of care is to await further evaluation by behavioral medicine and social work.  Patient determined to be appropriate for discharge back to his group home, social work has coordinated with his guardian to assist in eventual placement at different group home due to ongoing issues with patient running away.     Blake Divine, MD 02/01/19 [place]    Blake Divine, MD 02/01/19 [phone]

## 2019-02-01 NOTE — ED Provider Notes (Signed)
-----------------------------------------   3:03 AM on 02/01/2019 -----------------------------------------   Blood pressure 137/89, pulse 86, temperature 97.8 F (36.6 C), temperature source Oral, resp. rate 18, height 5\' 9"  (1.753 m), weight 77.1 kg, SpO2 99 %.  The patient is calm and cooperative at this time.  There have been no acute events since the last update.  Awaiting disposition plan from Behavioral Medicine and/or Social Work team(s).    Delman Kitten, MD 02/01/19 [phone]

## 2019-02-01 NOTE — ED Notes (Signed)
Called [facility] at [phone]. No answer and phone unable to receive voice mail.

## 2019-02-01 NOTE — ED Notes (Signed)
IVC PAPERS  RESCINDED  PER  JAMIE  NP  INFORMED  RN  Opal Sidles

## 2019-02-01 NOTE — ED Notes (Signed)
Warm wash cloth given to patient to was face and hands at this time.

## 2019-02-01 NOTE — ED Notes (Signed)
Pt up , standing at the door way of room , no distress noted

## 2019-02-01 NOTE — BH Assessment (Signed)
Write spoke with patient's legal guardian (Vince McKnight-[phone]) and updated him the patient is going to be discharge. Writer updated patient's nurse Opal Sidles).

## 2019-02-02 LAB — CLOZAPINE (CLOZARIL)
Clozapine Lvl: 268 ng/mL — ABNORMAL LOW (ref 350–650)
NorClozapine: 123 ng/mL
Total(Cloz+Norcloz): 391 ng/mL

## 2019-02-03 DIAGNOSIS — R4689 Other symptoms and signs involving appearance and behavior: Secondary | ICD-10-CM | POA: Insufficient documentation

## 2019-02-07 ENCOUNTER — Other Ambulatory Visit: Payer: Self-pay

## 2019-02-07 ENCOUNTER — Encounter: Payer: Self-pay | Admitting: Emergency Medicine

## 2019-02-07 ENCOUNTER — Emergency Department
Admission: EM | Admit: 2019-02-07 | Discharge: 2019-02-11 | Disposition: A | Discharge status: Home / Self Care | Payer: Medicare Other | Attending: Emergency Medicine | Admitting: Emergency Medicine

## 2019-02-07 DIAGNOSIS — F209 Schizophrenia, unspecified: Secondary | ICD-10-CM | POA: Diagnosis not present

## 2019-02-07 DIAGNOSIS — R451 Restlessness and agitation: Secondary | ICD-10-CM | POA: Diagnosis not present

## 2019-02-07 DIAGNOSIS — F29 Unspecified psychosis not due to a substance or known physiological condition: Secondary | ICD-10-CM | POA: Diagnosis present

## 2019-02-07 DIAGNOSIS — R4689 Other symptoms and signs involving appearance and behavior: Secondary | ICD-10-CM

## 2019-02-07 DIAGNOSIS — Z79899 Other long term (current) drug therapy: Secondary | ICD-10-CM | POA: Diagnosis not present

## 2019-02-07 DIAGNOSIS — F17210 Nicotine dependence, cigarettes, uncomplicated: Secondary | ICD-10-CM | POA: Diagnosis not present

## 2019-02-07 DIAGNOSIS — I10 Essential (primary) hypertension: Secondary | ICD-10-CM | POA: Insufficient documentation

## 2019-02-07 DIAGNOSIS — E039 Hypothyroidism, unspecified: Secondary | ICD-10-CM | POA: Insufficient documentation

## 2019-02-07 LAB — COMPREHENSIVE METABOLIC PANEL
ALT: 32 U/L (ref 0–44)
AST: 29 U/L (ref 15–41)
Albumin: 3.4 g/dL — ABNORMAL LOW (ref 3.5–5.0)
Alkaline Phosphatase: 73 U/L (ref 38–126)
Anion gap: 6 (ref 5–15)
BUN: 14 mg/dL (ref 8–23)
CO2: 27 mmol/L (ref 22–32)
Calcium: 9.3 mg/dL (ref 8.9–10.3)
Chloride: 109 mmol/L (ref 98–111)
Creatinine, Ser: 1.17 mg/dL (ref 0.61–1.24)
GFR calc Af Amer: >60 mL/min (ref >60)
GFR calc non Af Amer: >60 mL/min (ref >60)
Glucose, Bld: 118 mg/dL — ABNORMAL HIGH (ref 70–99)
Potassium: 4 mmol/L (ref 3.5–5.1)
Sodium: 142 mmol/L (ref 135–145)
Total Bilirubin: 0.5 mg/dL (ref 0.3–1.2)
Total Protein: 7.6 g/dL (ref 6.5–8.1)

## 2019-02-07 LAB — URINE DRUG SCREEN, QUALITATIVE (ARMC ONLY)
Amphetamines, Ur Screen: NOT DETECTED
Barbiturates, Ur Screen: NOT DETECTED
Benzodiazepine, Ur Scrn: NOT DETECTED
Cannabinoid 50 Ng, Ur ~~LOC~~: NOT DETECTED
Cocaine Metabolite,Ur ~~LOC~~: NOT DETECTED
MDMA (Ecstasy)Ur Screen: NOT DETECTED
Methadone Scn, Ur: NOT DETECTED
Opiate, Ur Screen: NOT DETECTED
Phencyclidine (PCP) Ur S: NOT DETECTED
Tricyclic, Ur Screen: NOT DETECTED

## 2019-02-07 LAB — CBC WITH DIFFERENTIAL/PLATELET
Abs Immature Granulocytes: 0.03 10*3/uL (ref 0.00–0.07)
Basophils Absolute: 0 10*3/uL (ref 0.0–0.1)
Basophils Relative: 0 %
Eosinophils Absolute: 0 10*3/uL (ref 0.0–0.5)
Eosinophils Relative: 0 %
HCT: 36.8 % — ABNORMAL LOW (ref 39.0–52.0)
Hemoglobin: 11.9 g/dL — ABNORMAL LOW (ref 13.0–17.0)
Immature Granulocytes: 1 %
Lymphocytes Relative: 35 %
Lymphs Abs: 1.9 10*3/uL (ref 0.7–4.0)
MCH: 31.5 pg (ref 26.0–34.0)
MCHC: 32.3 g/dL (ref 30.0–36.0)
MCV: 97.4 fL (ref 80.0–100.0)
Monocytes Absolute: 0.6 10*3/uL (ref 0.1–1.0)
Monocytes Relative: 10 %
Neutro Abs: 3 10*3/uL (ref 1.7–7.7)
Neutrophils Relative %: 54 %
Platelets: 115 10*3/uL — ABNORMAL LOW (ref 150–400)
RBC: 3.78 MIL/uL — ABNORMAL LOW (ref 4.22–5.81)
RDW: 14.9 % (ref 11.5–15.5)
WBC: 5.5 10*3/uL (ref 4.0–10.5)
nRBC: 0 % (ref 0.0–0.2)

## 2019-02-07 LAB — ETHANOL: Alcohol, Ethyl (B): <10 mg/dL (ref <10)

## 2019-02-07 LAB — CBC
HCT: 40.9 % (ref 39.0–52.0)
Hemoglobin: 13.1 g/dL (ref 13.0–17.0)
MCH: 31.3 pg (ref 26.0–34.0)
MCHC: 32 g/dL (ref 30.0–36.0)
MCV: 97.8 fL (ref 80.0–100.0)
Platelets: 123 10*3/uL — ABNORMAL LOW (ref 150–400)
RBC: 4.18 MIL/uL — ABNORMAL LOW (ref 4.22–5.81)
RDW: 14.9 % (ref 11.5–15.5)
WBC: 4.7 10*3/uL (ref 4.0–10.5)
nRBC: 0 % (ref 0.0–0.2)

## 2019-02-07 LAB — VALPROIC ACID LEVEL: Valproic Acid Lvl: 102 ug/mL — ABNORMAL HIGH (ref 50.0–100.0)

## 2019-02-07 LAB — ACETAMINOPHEN LEVEL: Acetaminophen (Tylenol), Serum: <10 ug/mL — ABNORMAL LOW (ref 10–30)

## 2019-02-07 LAB — SALICYLATE LEVEL: Salicylate Lvl: <7 mg/dL (ref 2.8–30.0)

## 2019-02-07 MED ORDER — DOCUSATE SODIUM 100 MG PO CAPS
100.0000 mg | ORAL_CAPSULE | Freq: Two times a day (BID) | ORAL | Status: DC
  Administered 2019-02-07 – 2019-02-11 (×8): 100 mg via ORAL
  Filled 2019-02-07 (×8): qty 1

## 2019-02-07 MED ORDER — CLOZAPINE 100 MG PO TABS
200.0000 mg | ORAL_TABLET | Freq: Two times a day (BID) | ORAL | Status: DC
  Administered 2019-02-07 – 2019-02-11 (×8): 200 mg via ORAL
  Filled 2019-02-07 (×8): qty 2

## 2019-02-07 MED ORDER — DIVALPROEX SODIUM 500 MG PO DR TAB
500.0000 mg | DELAYED_RELEASE_TABLET | Freq: Three times a day (TID) | ORAL | Status: DC
  Administered 2019-02-07 – 2019-02-08 (×3): 500 mg via ORAL
  Filled 2019-02-07 (×3): qty 1

## 2019-02-07 MED ORDER — OXYBUTYNIN CHLORIDE 5 MG PO TABS
5.0000 mg | ORAL_TABLET | Freq: Two times a day (BID) | ORAL | Status: DC
  Administered 2019-02-08 – 2019-02-11 (×6): 5 mg via ORAL
  Filled 2019-02-07 (×11): qty 1

## 2019-02-07 MED ORDER — LEVOTHYROXINE SODIUM 75 MCG PO TABS
200.0000 ug | ORAL_TABLET | Freq: Every day | ORAL | Status: DC
  Administered 2019-02-09 – 2019-02-11 (×3): 200 ug via ORAL
  Filled 2019-02-07 (×4): qty 2

## 2019-02-07 MED ORDER — TAMSULOSIN HCL 0.4 MG PO CAPS
0.4000 mg | ORAL_CAPSULE | Freq: Every day | ORAL | Status: DC
  Administered 2019-02-07 – 2019-02-11 (×5): 0.4 mg via ORAL
  Filled 2019-02-07 (×5): qty 1

## 2019-02-07 MED ORDER — TRAZODONE HCL 50 MG PO TABS
150.0000 mg | ORAL_TABLET | Freq: Every day | ORAL | Status: DC
  Administered 2019-02-07 – 2019-02-10 (×4): 150 mg via ORAL
  Filled 2019-02-07 (×4): qty 1

## 2019-02-07 MED ORDER — BENZTROPINE MESYLATE 1 MG PO TABS
0.5000 mg | ORAL_TABLET | Freq: Two times a day (BID) | ORAL | Status: DC
  Administered 2019-02-07 – 2019-02-11 (×8): 0.5 mg via ORAL
  Filled 2019-02-07 (×8): qty 1

## 2019-02-07 MED ORDER — DIVALPROEX SODIUM 250 MG PO DR TAB
1000.0000 mg | DELAYED_RELEASE_TABLET | Freq: Two times a day (BID) | ORAL | Status: DC
  Administered 2019-02-08 – 2019-02-11 (×7): 1000 mg via ORAL
  Filled 2019-02-07 (×7): qty 4

## 2019-02-07 MED ORDER — PANTOPRAZOLE SODIUM 40 MG PO TBEC
40.0000 mg | DELAYED_RELEASE_TABLET | Freq: Every day | ORAL | Status: DC
  Administered 2019-02-07 – 2019-02-11 (×5): 40 mg via ORAL
  Filled 2019-02-07 (×5): qty 1

## 2019-02-07 MED ORDER — LITHIUM CARBONATE ER 450 MG PO TBCR
450.0000 mg | EXTENDED_RELEASE_TABLET | Freq: Two times a day (BID) | ORAL | Status: DC
  Administered 2019-02-07 – 2019-02-11 (×8): 450 mg via ORAL
  Filled 2019-02-07 (×8): qty 1

## 2019-02-07 MED ORDER — BUSPIRONE HCL 10 MG PO TABS
5.0000 mg | ORAL_TABLET | Freq: Three times a day (TID) | ORAL | Status: DC
  Administered 2019-02-07 – 2019-02-11 (×12): 5 mg via ORAL
  Filled 2019-02-07 (×12): qty 1

## 2019-02-07 MED ORDER — PANTOPRAZOLE SODIUM 40 MG PO TBEC: 40.0000 mg | DELAYED_RELEASE_TABLET | Freq: Every day | ORAL | Status: DC

## 2019-02-07 MED ORDER — QUETIAPINE FUMARATE 25 MG PO TABS
100.0000 mg | ORAL_TABLET | Freq: Every day | ORAL | Status: DC
  Administered 2019-02-07 – 2019-02-10 (×4): 100 mg via ORAL
  Filled 2019-02-07 (×5): qty 4

## 2019-02-07 MED ORDER — HALOPERIDOL 5 MG PO TABS
10.0000 mg | ORAL_TABLET | Freq: Three times a day (TID) | ORAL | Status: DC
  Administered 2019-02-07 – 2019-02-11 (×12): 10 mg via ORAL
  Filled 2019-02-07 (×12): qty 2

## 2019-02-07 MED ORDER — FINASTERIDE 5 MG PO TABS
5.0000 mg | ORAL_TABLET | Freq: Every day | ORAL | Status: DC
  Administered 2019-02-08 – 2019-02-11 (×4): 5 mg via ORAL
  Filled 2019-02-07 (×4): qty 1

## 2019-02-07 MED ORDER — POLYETHYLENE GLYCOL 3350 17 G PO PACK
17.0000 g | PACK | Freq: Every day | ORAL | Status: DC
  Administered 2019-02-08 – 2019-02-11 (×3): 17 g via ORAL
  Filled 2019-02-07 (×4): qty 1

## 2019-02-07 MED ORDER — DEUTETRABENAZINE 6 MG PO TABS: 12.0000 mg | ORAL_TABLET | Freq: Two times a day (BID) | ORAL | Status: DC

## 2019-02-07 NOTE — BH Assessment (Signed)
Writer attempted to get collateral information from the Group Home, [facility] [phone]), but was unable to leave a message because the mailbox was full.
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
## 2019-02-07 NOTE — ED Notes (Signed)
Hourly rounding reveals patient sleeping in room. No complaints, stable, in no acute distress. Q15 minute rounds and monitoring via Security Cameras to continue. 

## 2019-02-07 NOTE — ED Notes (Signed)
Patient talking to psych NP Lord and TTS Calvin  

## 2019-02-07 NOTE — ED Notes (Signed)
This tech gave pt 2 cranberry juice.  

## 2019-02-07 NOTE — ED Notes (Signed)
Hourly rounding reveals patient in room. No complaints, stable, in no acute distress. Q15 minute rounds and monitoring via Security Cameras to continue. 
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
## 2019-02-07 NOTE — ED Notes (Signed)
This tech gave pt dinner tray. 
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
## 2019-02-07 NOTE — ED Notes (Signed)
Patient is in room, NAD noted he is watching television and eating dinner

## 2019-02-07 NOTE — ED Notes (Signed)
Patient talking to MD KP

## 2019-02-07 NOTE — ED Notes (Signed)
Pt dressed out by this RN and Mickel Baas, EDT, as well as Statistician with [employer]. Pt belongings were placed in a pt belongings bag and labeled with a pt sticker. Pt was placed in hospital provided scrubs. Pt had with him the following.  1 pair of purple hospital type socks 1 pair of black pants 1 red t shirt 1 pair of dark underwear 1 black hat  Pt dressed out without incident.
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
## 2019-02-07 NOTE — ED Notes (Signed)
Pt resting in room. Pt is calm.

## 2019-02-07 NOTE — Progress Notes (Signed)
CLOZAPINE MONITORING (reflects NEW REMS GUIDELINES EFFECTIVE 03/21/2014):  Check ANC at least weekly while inpatient.  For general population patients, i.e., those without benign ethnic neutropenia (BEN): --If [place] 1000-1499, increase ANC monitoring to 3x/wk --If ANC < 1000, HOLD CLOZAPINE and get psych consult   For patients with BEN: --If [place], increase ANC monitoring to 3x/wk --If ANC < 500, HOLD CLOZAPINE and get psych consult  08/30@1642 : ANC 6,295/MW  REMS-certified psychiatry provider can continue drug with [place] below cited thresholds if they document medical opinion that the neutropenia is not clozapine-induced (heme consult is recommended) or that risk of interrupting therapy is greater than the risk of developing severe neutropenia.  --SEE PRESCRIBING INFORMATION FOR ADDITIONAL DETAILS

## 2019-02-07 NOTE — ED Notes (Signed)
IVC/Consult ordered/completed/Pending D/C to Group Home

## 2019-02-07 NOTE — ED Notes (Signed)
Transferred to room BHU 1 by this tech and security guard [employer]
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
## 2019-02-07 NOTE — ED Notes (Signed)
Report to include Situation, Background, Assessment, and Recommendations received from Jadeka RN. Patient alert and oriented, warm and dry, in no acute distress. Patient denies SI, HI, AVH and pain. Patient made aware of Q15 minute rounds and security cameras for their safety. Patient instructed to come to me with needs or concerns. 

## 2019-02-07 NOTE — ED Notes (Signed)
Pt sleeping at this time.

## 2019-02-07 NOTE — ED Notes (Signed)
Moved to BHU-1 

## 2019-02-08 DIAGNOSIS — F209 Schizophrenia, unspecified: Secondary | ICD-10-CM | POA: Diagnosis not present

## 2019-02-08 DIAGNOSIS — R451 Restlessness and agitation: Secondary | ICD-10-CM | POA: Diagnosis not present

## 2019-02-08 LAB — LITHIUM LEVEL: Lithium Lvl: 1.11 mmol/L (ref 0.60–1.20)

## 2019-02-08 MED ORDER — DIVALPROEX SODIUM 500 MG PO DR TAB
500.0000 mg | DELAYED_RELEASE_TABLET | Freq: Two times a day (BID) | ORAL | Status: DC
  Administered 2019-02-08 – 2019-02-11 (×6): 500 mg via ORAL
  Filled 2019-02-08 (×6): qty 1

## 2019-02-08 NOTE — ED Notes (Signed)
BEHAVIORAL HEALTH ROUNDING Patient sleeping: No. Patient alert and oriented: yes Behavior appropriate: Yes.  ; If no, describe:  Nutrition and fluids offered: yes Toileting and hygiene offered: Yes  Sitter present: q15 minute observations and security camera monitoring   
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
## 2019-02-08 NOTE — ED Notes (Signed)
Hourly rounding reveals patient sleeping in room. No complaints, stable, in no acute distress. Q15 minute rounds and monitoring via Security Cameras to continue. 

## 2019-02-08 NOTE — ED Notes (Signed)
ED BHU [place] Is the patient under IVC or is there intent for IVC: Yes.   Is the patient medically cleared: Yes.   Is there vacancy in the ED BHU: Yes.   Is the population mix appropriate for patient: Yes.   Is the patient awaiting placement in inpatient or outpatient setting: Yes. Awaiting group home placement   Has the patient had a psychiatric consult: Yes.   Survey of unit performed for contraband, proper placement and condition of furniture, tampering with fixtures in bathroom, shower, and each patient room: Yes.  ; Findings:  APPEARANCE/BEHAVIOR Calm and cooperative NEURO ASSESSMENT Orientation: oriented x self, place    Denies pain Hallucinations: No.None noted (Hallucinations)  Denies at this time Speech: Normal Gait: normal RESPIRATORY ASSESSMENT Even  Unlabored respirations  CARDIOVASCULAR ASSESSMENT Pulses equal   regular rate  Skin warm and dry   GASTROINTESTINAL ASSESSMENT no GI complaint EXTREMITIES Full ROM  PLAN OF CARE Provide calm/safe environment. Vital signs assessed twice daily. ED BHU Assessment once each 12-hour shift.  Assure the ED provider has rounded once each shift. Provide and encourage hygiene. Provide redirection as needed. Assess for escalating behavior; address immediately and inform ED provider.  Assess family dynamic and appropriateness for visitation as needed: Yes.  ; If necessary, describe findings:  Educate the patient/family about BHU procedures/visitation: Yes.  ; If necessary, describe findings:

## 2019-02-08 NOTE — ED Notes (Signed)
ED psychiatry provider notified of patient VP level. Approval granted to give scheduled depakote.

## 2019-02-08 NOTE — ED Notes (Signed)
He is standing in the dayroom by the door - waving and stating  "Good morning"  NAD observed

## 2019-02-08 NOTE — ED Notes (Signed)
Pt asked to use the bathroom and told this EDT he had wet his pants. Pt was given a new pair of pants, and bed sheet was changed. Pt asked for more snacks, but was informed breakfast would be served at 8am, and was redirected back to bed.

## 2019-02-08 NOTE — ED Provider Notes (Signed)
-----------------------------------------   6:13 AM on 02/08/2019 -----------------------------------------   Blood pressure 119/84, pulse 79, temperature 98.5 F (36.9 C), temperature source Oral, resp. rate 20, height 5\' 9"  (1.753 m), weight 85.7 kg, SpO2 100 %.  The patient is calm and cooperative at this time.  There have been no acute events since the last update.  Awaiting disposition plan from Behavioral Medicine and/or Social Work team(s).   Paulette Blanch, MD 02/08/19 [phone]

## 2019-02-08 NOTE — ED Notes (Signed)
Pt has continuously asked for snacks, Pt was given crackers and told breakfast will be served at 8am.

## 2019-02-08 NOTE — ED Notes (Signed)
Pt to door asking for water.  Pt given a cup of water and returned to bed.

## 2019-02-08 NOTE — ED Notes (Signed)
Pt given meal tray.

## 2019-02-08 NOTE — ED Notes (Signed)

## 2019-02-08 NOTE — ED Notes (Signed)
Hourly rounding reveals patient in room. No complaints, stable, in no acute distress. Q15 minute rounds and monitoring via Security Cameras to continue. 

## 2019-02-08 NOTE — ED Notes (Signed)
Report on Situation, Background, Assessment, and Recommendations received from Amy, RN. Patient alert and in no visible distress. Patient made aware of Q15 minute rounds and security cameras for their safety. Patient instructed to come to me with needs or concerns.   

## 2019-02-08 NOTE — ED Notes (Signed)
Patient given juice upon request.

## 2019-02-08 NOTE — ED Notes (Signed)
Family at bedside. 

## 2019-02-08 NOTE — ED Notes (Signed)
Pt ambulated to bathroom.  Door locked after use

## 2019-02-08 NOTE — ED Notes (Signed)
Linens changed  - he has taken a shower

## 2019-02-08 NOTE — ED Notes (Signed)
Given a snack tray and juice 

## 2019-02-08 NOTE — ED Notes (Signed)
Hourly rounding reveals patient in room. Stable, in no acute distress. Q15 minute rounds and monitoring via Security Cameras to continue. 

## 2019-02-09 DIAGNOSIS — F209 Schizophrenia, unspecified: Secondary | ICD-10-CM | POA: Diagnosis not present

## 2019-02-09 DIAGNOSIS — R451 Restlessness and agitation: Secondary | ICD-10-CM | POA: Diagnosis not present

## 2019-02-09 NOTE — ED Notes (Signed)
Hourly rounding reveals patient in day room. No complaints, stable, in no acute distress. Q15 minute rounds and monitoring via Security Cameras to continue. 

## 2019-02-09 NOTE — ED Notes (Signed)
Hourly rounding reveals patient sleeping in room. No complaints, stable, in no acute distress. Q15 minute rounds and monitoring via Security Cameras to continue. 

## 2019-02-09 NOTE — ED Notes (Signed)
Pt ambulated to bathroom 

## 2019-02-09 NOTE — ED Notes (Signed)
Pt ambulated to bathroom.  Door locked after use 

## 2019-02-09 NOTE — ED Notes (Signed)
Hourly rounding reveals patient in room. No complaints, stable, in no acute distress. Q15 minute rounds and monitoring via Security Cameras to continue. 

## 2019-02-09 NOTE — Social Work (Signed)
TTS informed CSW that patient's guardian will be working on emergency placement.  Will follow-up.    [place], [place] [facility]  [phone]

## 2019-02-09 NOTE — ED Notes (Signed)
Pt at the door asking for water.  Water given and pt back in his room

## 2019-02-09 NOTE — ED Notes (Signed)
Patient ate 100% of lunch and beverage.  

## 2019-02-09 NOTE — ED Provider Notes (Signed)
5:12 AM 02/10/2019  Blood pressure (!) 141/96, pulse 95, temperature 98.3 F (36.8 C), temperature source Oral, resp. rate 18, height 5\' 9"  (1.753 m), weight 85.7 kg, SpO2 99 %.   The patient is calm and cooperative at this time.  There have been no acute events since the last update.  Awaiting disposition plan from Behavioral Medicine team. Plan to re-evaluate in AM and hopefully d.c.    Vanessa Butler, MD 02/09/19 [phone]

## 2019-02-09 NOTE — Social Work (Signed)
CSW attempted to contact patient's guardian Rudy Jew [phone])  to see if patient would be able to wait at his previous group home while waiting for placement at his next group home. Voicemail left.    Per notes guardian has connected with [facility] to find patient a group home.    [place], [place] [facility]  [phone]

## 2019-02-09 NOTE — ED Notes (Signed)
Report to include Situation, Background, Assessment, and Recommendations received from Wendy RN. Patient alert and oriented, warm and dry, in no acute distress. Patient denies SI, HI, AVH and pain. Patient made aware of Q15 minute rounds and security cameras for their safety. Patient instructed to come to me with needs or concerns.  

## 2019-02-09 NOTE — ED Notes (Signed)
Patient is watching tv, no signs of distress, will continue to monitor, q 15 minute checks and camera surveillance in progress for safety.

## 2019-02-09 NOTE — ED Notes (Signed)
Patient ate 100% of supper and beverage. Patient without any signs of distress, stills comes to nursing station and will stare in intervals. Patient drools often, Patient is safe, will continue to monitor.
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
## 2019-02-10 DIAGNOSIS — R451 Restlessness and agitation: Secondary | ICD-10-CM | POA: Diagnosis not present

## 2019-02-10 DIAGNOSIS — F209 Schizophrenia, unspecified: Secondary | ICD-10-CM | POA: Diagnosis not present

## 2019-02-10 NOTE — ED Notes (Signed)
Dinner tray and cup of water given to pt. 

## 2019-02-10 NOTE — ED Notes (Signed)
Hourly rounding reveals patient in room. No complaints, stable, in no acute distress. Q15 minute rounds and monitoring via Security Cameras to continue. 

## 2019-02-10 NOTE — ED Notes (Signed)
BEHAVIORAL HEALTH ROUNDING Patient sleeping: No. Patient alert and oriented: yes Behavior appropriate: Yes.  ; If no, describe:  Nutrition and fluids offered: yes Toileting and hygiene offered: Yes  Sitter present: q15 minute observations and security camera monitoring   

## 2019-02-10 NOTE — ED Notes (Signed)
Pt. Calm and cooperative.  Pt. Wants new group home or to live with Aunt in [place].  Pt. Denies SI/HI.
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
## 2019-02-10 NOTE — ED Notes (Signed)
Hourly rounding reveals patient sleeping in room. No complaints, stable, in no acute distress. Q15 minute rounds and monitoring via Security Cameras to continue. 

## 2019-02-10 NOTE — ED Notes (Signed)
Reviewed IVC/Consult completed/Pending Group Home Placement 

## 2019-02-10 NOTE — ED Notes (Signed)
Pt observed sitting in a dayroom chair - covered with his blanket  NAD observed  Smiled and states "good morning"  He continues to await safe placement

## 2019-02-10 NOTE — ED Notes (Signed)
Pt. Given meal tray, extra crackers and PB.  Pt. Also given cup of soda and cup of water.

## 2019-02-10 NOTE — ED Provider Notes (Signed)
-----------------------------------------   5:30 AM on 02/10/2019 -----------------------------------------   Blood pressure 134/74, pulse 87, temperature 98.5 F (36.9 C), temperature source Oral, resp. rate 16, height 1.753 m (5\' 9" ), weight 85.7 kg, SpO2 98 %.  The patient is calm and cooperative at this time.  There have been no acute events since the last update.  Awaiting disposition plan from Behavioral Medicine and/or Social Work team(s).   Hinda Kehr, MD 02/10/19 0530

## 2019-02-10 NOTE — ED Notes (Signed)
ED BHU [place] Is the patient under IVC or is there intent for IVC: Yes.   Is the patient medically cleared: Yes.   Is there vacancy in the ED BHU: Yes.   Is the population mix appropriate for patient: Yes.   Is the patient awaiting placement in inpatient or outpatient setting: Yes.   Has the patient had a psychiatric consult: Yes.   Survey of unit performed for contraband, proper placement and condition of furniture, tampering with fixtures in bathroom, shower, and each patient room: Yes.  ; Findings:  APPEARANCE/BEHAVIOR Calm and cooperative NEURO ASSESSMENT Orientation: oriented to self and place  Denies pain Hallucinations: No.None noted (Hallucinations) denies at this time  Speech: Normal Gait: normal RESPIRATORY ASSESSMENT Even  Unlabored respirations  CARDIOVASCULAR ASSESSMENT Pulses equal   regular rate  Skin warm and dry   GASTROINTESTINAL ASSESSMENT no GI complaint EXTREMITIES Full ROM  PLAN OF CARE Provide calm/safe environment. Vital signs assessed twice daily. ED BHU Assessment once each 12-hour shift.  Assure the ED provider has rounded once each shift. Provide and encourage hygiene. Provide redirection as needed. Assess for escalating behavior; address immediately and inform ED provider.  Assess family dynamic and appropriateness for visitation as needed: Yes.  ; If necessary, describe findings:  Educate the patient/family about BHU procedures/visitation: Yes.  ; If necessary, describe findings:

## 2019-02-10 NOTE — ED Notes (Signed)
Hourly rounding reveals patient in rest room. No complaints, stable, in no acute distress. Q15 minute rounds and monitoring via Security Cam[employer] to continue. 

## 2019-02-10 NOTE — ED Notes (Signed)

## 2019-02-10 NOTE — Social Work (Signed)
CSW contacted patient's guardian Rudy Jew [phone]), to ask for any updates regarding patient's placement. Evette Doffing shared that a group home in [place] is currently looking at patient's paperwork.  Evette Doffing will see if patient can go back to previous group home while waiting placement for his new one since he has "a few days left" until his 30 day notice is up.    [place], [place] [facility]  [phone]

## 2019-02-11 DIAGNOSIS — R451 Restlessness and agitation: Secondary | ICD-10-CM | POA: Diagnosis not present

## 2019-02-11 NOTE — ED Notes (Signed)
Patient discharged back to group home with Sharlot Gowda, patient and Mr. Karin Lieu received discharge papers. Patient received belongings and verbalized he has received all of his belongings. Patient appropriate and cooperative, Denies SI/HI AVH. Vital signs taken. NAD noted.

## 2019-02-11 NOTE — Social Work (Addendum)
CSW was informed by patient's guardian to contact the group home to let them know patient is ready for discharge if he can stay there until a new place is found.  CSW trying to contact [facility] at [facility] group [facility]. Voicemail left.   1:12pm - Elonda Husky shared that he will contact patient's guardian to determine where he is in finding patient placement. Elonda Husky shared that he will call CSW back to let her know if patient can return to group home until placement is found.   3:53pm - CSW called Elonda Husky to follow up, he did not pick. Voicemail left.   [place], [place] ED  [phone]

## 2019-02-11 NOTE — Social Work (Addendum)
Randy Ferguson from patients group home, [facility], will be coming to pick patient up at 5:30pm.   Patient's 30 day notice will be up on   02/28/2019.    EDP notified  Psych NP notified  RN notified   [name]  [facility] ED [phone]

## 2019-02-11 NOTE — ED Notes (Signed)
Pt. Up using bathroom. 

## 2019-02-11 NOTE — ED Provider Notes (Signed)
-----------------------------------------   3:51 AM on 02/11/2019 -----------------------------------------   Blood pressure 140/86, pulse 92, temperature 98.1 F (36.7 C), temperature source Oral, resp. rate 18, height 5\' 9"  (1.753 m), weight 85.7 kg, SpO2 100 %.  The patient had no acute events since last update.  Calm and cooperative at this time.  Disposition is pending per Psychiatry/Behavioral Medicine team recommendations.     Alfred Levins, [place], MD 02/11/19 [phone]

## 2019-02-11 NOTE — ED Notes (Signed)
Gave pt food tray with juice. 

## 2019-02-11 NOTE — ED Provider Notes (Signed)
-----------------------------------------   5:06 PM on 02/11/2019 -----------------------------------------  Patient has been seen and evaluated by psychiatry.  They have discontinued the patient's IVC.  The patient's group home has agreed to take the patient back to the group home.  We will discharge the patient from the emergency department.  Medical work-up is been largely nonrevealing.   Harvest Dark, MD 02/11/19 [phone]

## 2019-02-11 NOTE — ED Notes (Signed)
Pt used restroom with help from security. Pt was also given graham crackers, peanut butter,and juice.

## 2019-02-11 NOTE — ED Notes (Signed)
BEHAVIORAL HEALTH ROUNDING Patient sleeping: No. Patient alert and oriented: yes Behavior appropriate: Yes.  ; If no, describe:  Nutrition and fluids offered: yes Toileting and hygiene offered: Yes  Sitter present: q15 minute observations and security monitoring Law enforcement present: Yes    

## 2019-02-11 NOTE — ED Notes (Signed)
Spoke to patients legal guardian Delton See that patient will be discharged soon

## 2019-06-23 IMAGING — CR CHEST - 2 VIEW
2 series · 2 of 2 positions shown · non-contrast
Comparison: Portable chest 10/02/2012 and earlier.

CLINICAL DATA: 62-year-old male with low-grade fever and
tachycardia.

EXAM:
CHEST - 2 VIEW

[chest lat]
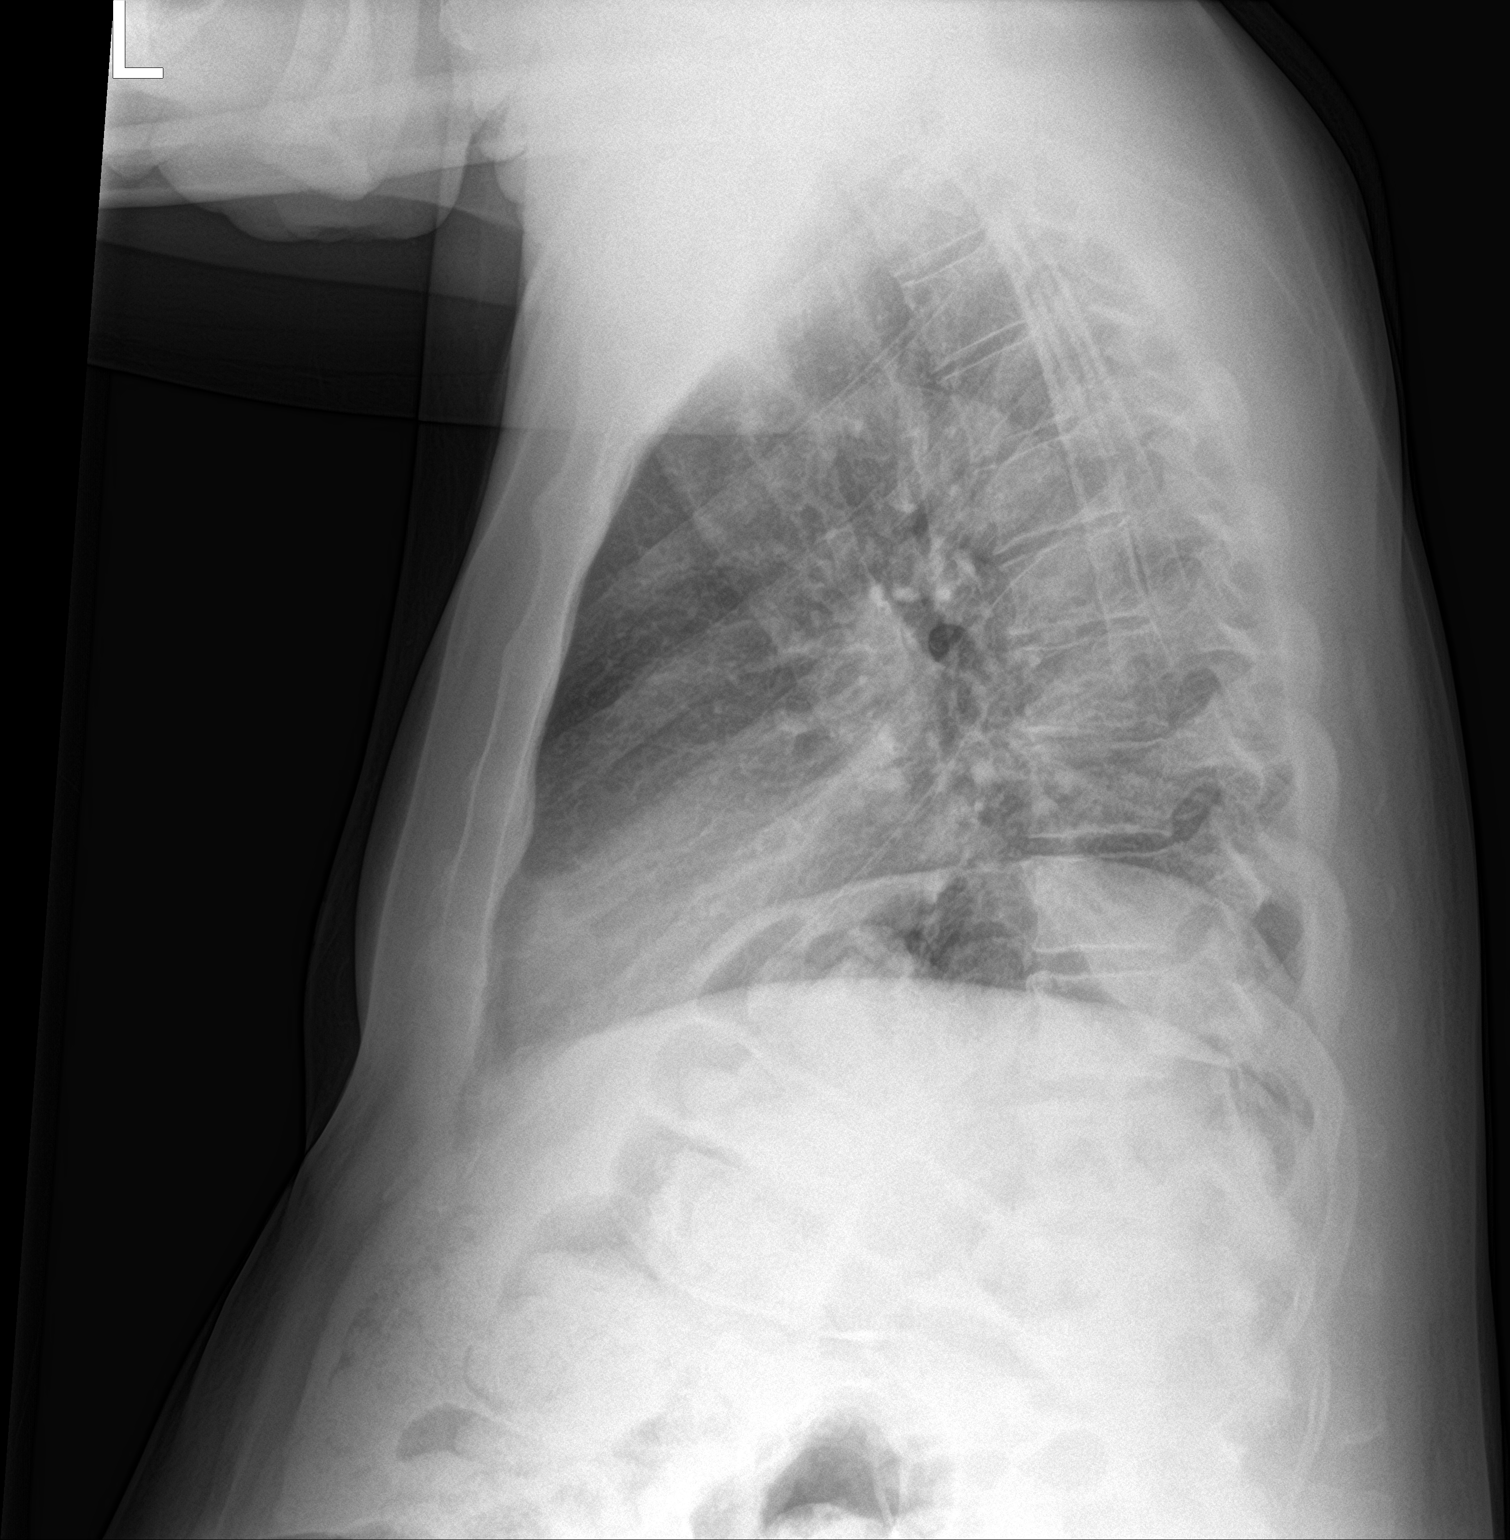

[chest ap]
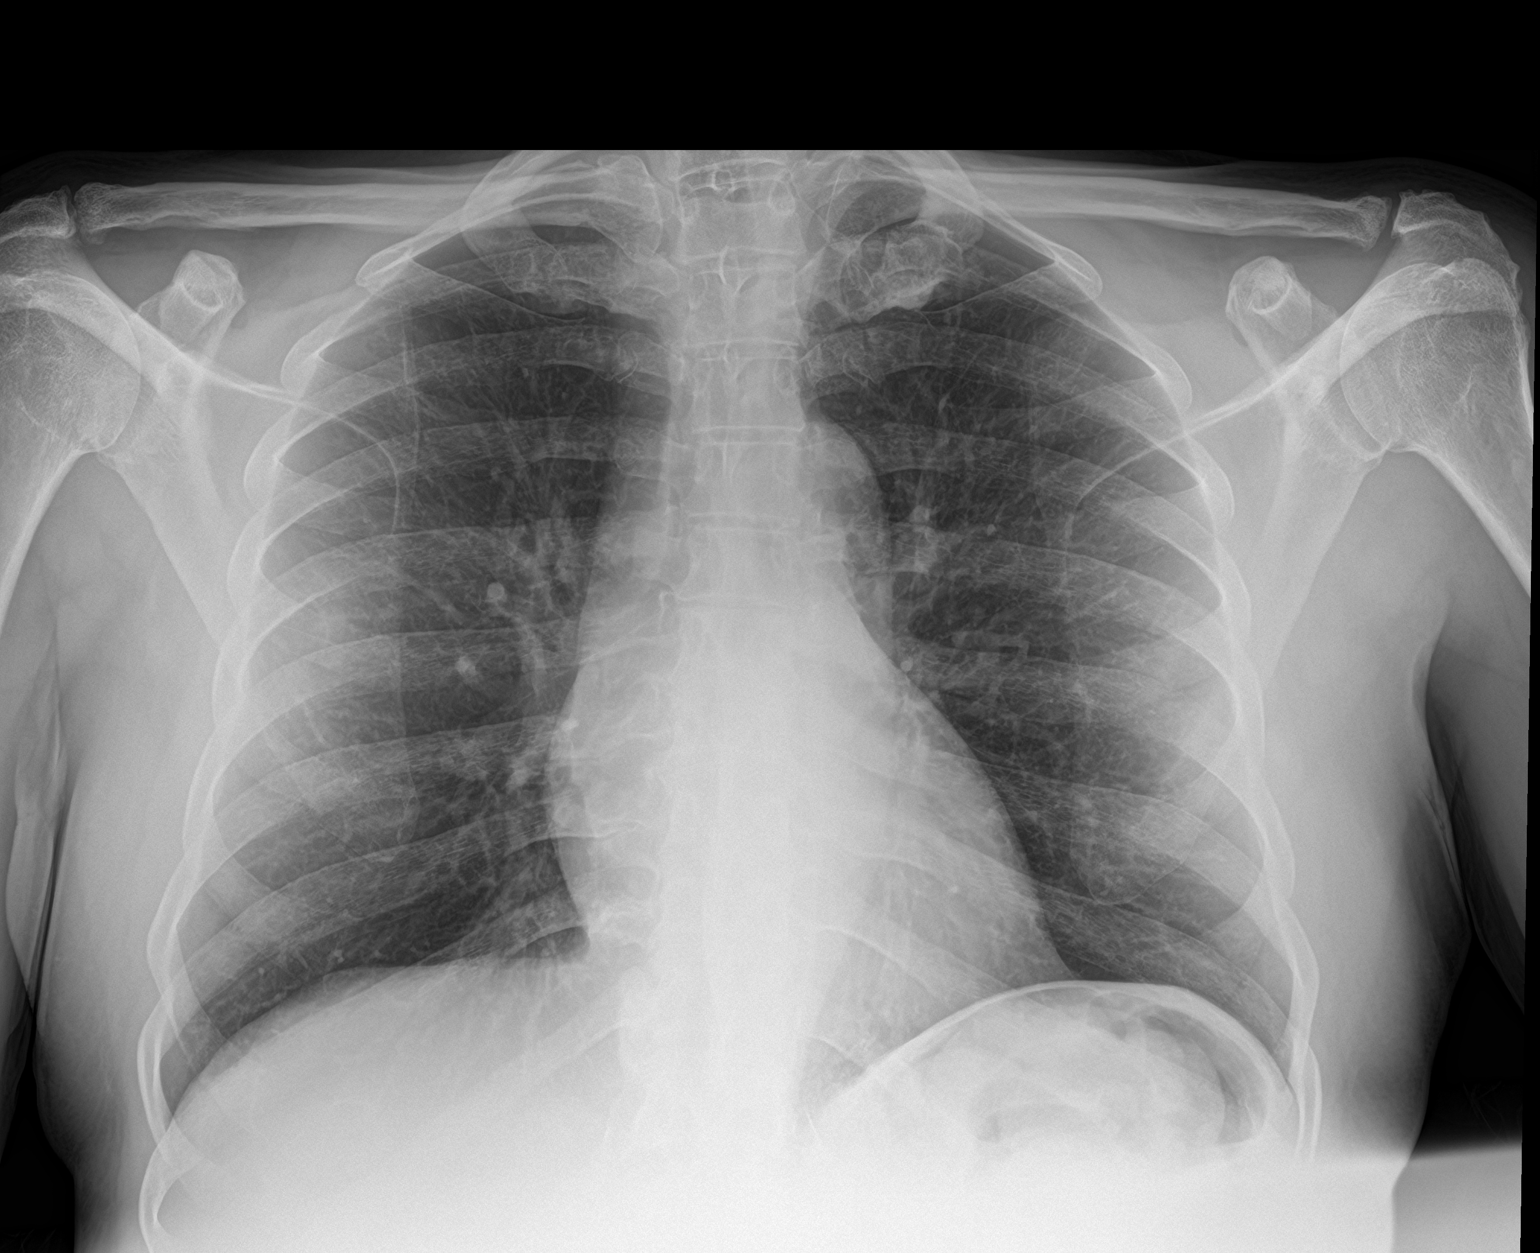

[2 of 2 positions shown; findings below may reference images not displayed]

FINDINGS: Seated AP and lateral views. Mediastinal contours remain within
normal limits. No cardiomegaly. Visualized tracheal air column is
within normal limits. Stable lung volumes. No pneumothorax,
pulmonary edema, pleural effusion or confluent pulmonary opacity. No
acute osseous abnormality identified. Stable visible bowel gas
pattern.
IMPRESSION: No acute cardiopulmonary abnormality.

## 2019-10-09 DEATH — deceased
